# Patient Record
Sex: Male | Born: 1990 | Race: Black or African American | Hispanic: No | Marital: Single | Smoking: Never smoker
Health system: Southern US, Community
[De-identification: ages and names within clinical notes are randomized; demographics above are authoritative.]

## PROBLEM LIST (undated history)

## (undated) DIAGNOSIS — J45909 Unspecified asthma, uncomplicated: Secondary | ICD-10-CM

## (undated) HISTORY — PX: RECONSTRUCTION OF NOSE: SHX2301

## (undated) HISTORY — PX: APPENDECTOMY: SHX54

---

## 2013-10-19 ENCOUNTER — Encounter (HOSPITAL_COMMUNITY): Payer: Self-pay | Admitting: Emergency Medicine

## 2013-10-19 ENCOUNTER — Emergency Department (HOSPITAL_COMMUNITY)
Admission: EM | Admit: 2013-10-19 | Discharge: 2013-10-19 | Disposition: A | Discharge status: Home / Self Care | Payer: Medicaid Other | Attending: Emergency Medicine | Admitting: Emergency Medicine

## 2013-10-19 DIAGNOSIS — W208XXA Other cause of strike by thrown, projected or falling object, initial encounter: Secondary | ICD-10-CM | POA: Insufficient documentation

## 2013-10-19 DIAGNOSIS — S8990XA Unspecified injury of unspecified lower leg, initial encounter: Secondary | ICD-10-CM | POA: Insufficient documentation

## 2013-10-19 DIAGNOSIS — Y939 Activity, unspecified: Secondary | ICD-10-CM | POA: Insufficient documentation

## 2013-10-19 DIAGNOSIS — S99919A Unspecified injury of unspecified ankle, initial encounter: Secondary | ICD-10-CM

## 2013-10-19 DIAGNOSIS — Y929 Unspecified place or not applicable: Secondary | ICD-10-CM | POA: Insufficient documentation

## 2013-10-19 DIAGNOSIS — S99929A Unspecified injury of unspecified foot, initial encounter: Secondary | ICD-10-CM

## 2013-10-19 DIAGNOSIS — S8010XA Contusion of unspecified lower leg, initial encounter: Secondary | ICD-10-CM | POA: Insufficient documentation

## 2013-10-19 DIAGNOSIS — S239XXA Sprain of unspecified parts of thorax, initial encounter: Secondary | ICD-10-CM | POA: Insufficient documentation

## 2013-10-19 DIAGNOSIS — IMO0002 Reserved for concepts with insufficient information to code with codable children: Secondary | ICD-10-CM

## 2013-10-19 DIAGNOSIS — G8921 Chronic pain due to trauma: Secondary | ICD-10-CM | POA: Insufficient documentation

## 2013-10-19 MED ORDER — CYCLOBENZAPRINE HCL 10 MG PO TABS: 10.0000 mg | ORAL_TABLET | Freq: Two times a day (BID) | ORAL | Status: DC | PRN
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
## 2013-10-19 NOTE — ED Notes (Signed)
pa at bedside. 
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
## 2013-10-19 NOTE — Discharge Instructions (Signed)
1. Medications: flexeril, ibuprofen 800mg  TID, usual home medications 2. Treatment: rest, drink plenty of fluids, gentle stretching as discussed, alternate ice and heat 3. Follow Up: Please followup with your primary doctor for discussion of your diagnoses and further evaluation after today's visit; if you do not have a primary care doctor use the resource guide provided to find one;   Back Exercises Back exercises help treat and prevent back injuries. The goal of back exercises is to increase the strength of your abdominal and back muscles and the flexibility of your back. These exercises should be started when you no longer have back pain. Back exercises include:  Pelvic Tilt. Lie on your back with your knees bent. Tilt your pelvis until the lower part of your back is against the floor. Hold this position 5 to 10 sec and repeat 5 to 10 times.  Knee to Chest. Pull first 1 knee up against your chest and hold for 20 to 30 seconds, repeat this with the other knee, and then both knees. This may be done with the other leg straight or bent, whichever feels better.  Sit-Ups or Curl-Ups. Bend your knees 90 degrees. Start with tilting your pelvis, and do a partial, slow sit-up, lifting your trunk only 30 to 45 degrees off the floor. Take at least 2 to 3 seconds for each sit-up. Do not do sit-ups with your knees out straight. If partial sit-ups are difficult, simply do the above but with only tightening your abdominal muscles and holding it as directed.  Hip-Lift. Lie on your back with your knees flexed 90 degrees. Push down with your feet and shoulders as you raise your hips a couple inches off the floor; hold for 10 seconds, repeat 5 to 10 times.  Back arches. Lie on your stomach, propping yourself up on bent elbows. Slowly press on your hands, causing an arch in your low back. Repeat 3 to 5 times. Any initial stiffness and discomfort should lessen with repetition over time.  Shoulder-Lifts. Lie face down  with arms beside your body. Keep hips and torso pressed to floor as you slowly lift your head and shoulders off the floor. Do not overdo your exercises, especially in the beginning. Exercises may cause you some mild back discomfort which lasts for a few minutes; however, if the pain is more severe, or lasts for more than 15 minutes, do not continue exercises until you see your caregiver. Improvement with exercise therapy for back problems is slow.  See your caregivers for assistance with developing a proper back exercise program. Document Released: 10/15/2004 Document Revised: 11/30/2011 Document Reviewed: 07/09/2011 [facility] Patient Information 2014 [place], [place].    Emergency Department Resource Guide 1) Find a Doctor and Pay Out of Pocket Although you won't have to find out who is covered by your insurance plan, it is a good idea to ask around and get recommendations. You will then need to call the office and see if the doctor you have chosen will accept you as a new patient and what types of options they offer for patients who are self-pay. Some doctors offer discounts or will set up payment plans for their patients who do not have insurance, but you will need to ask so you aren't surprised when you get to your appointment.  2) Contact Your Local Health Department Not all health departments have doctors that can see patients for sick visits, but many do, so it is worth a call to see if yours does. If you  don't know where your local health department is, you can check in your phone book. The CDC also has a tool to help you locate your state's health department, and many state websites also have listings of all of their local health departments.  3) Find a [facility] If your illness is not likely to be very severe or complicated, you may want to try a walk in clinic. These are popping up all over the country in pharmacies, drugstores, and shopping centers. They're usually staffed by nurse  practitioners or physician assistants that have been trained to treat common illnesses and complaints. They're usually fairly quick and inexpensive. However, if you have serious medical issues or chronic medical problems, these are probably not your best option.  No Primary Care Doctor: - Call Health Connect at  [phone] - they can help you locate a primary care doctor that  accepts your insurance, provides certain services, etc. - Physician Referral Service- [phone]  Chronic Pain Problems: Organization         Address  Phone   Notes  [facility]  [phone] Patients need to be referred by their primary care doctor.   Medication Assistance: Organization         Address  Phone   Notes  [facility] Medication [facility] [address] [place], [place] [address] [phone] --Must be a resident of [facility] -- Must have NO insurance coverage whatsoever (no Medicaid/ Medicare, etc.) -- The pt. MUST have a primary care doctor that directs their care regularly and follows them in the community   MedAssist  [phone]   [employer]  [phone]    Agencies that provide inexpensive medical care: Organization         Address  Phone   Notes  [place]  [phone]   [facility]    [phone]   [facility] [place], [place] [address] [phone]   [place] [address] [place] [phone]   Planned Parenthood    ([phone]   [facility]    [phone]   [place] and [place], [place] Phone:  [phone], Fax:  [phone] Hours of Operation:  9 am - 6 pm, M-F.  Also accepts Medicaid/Medicare and self-pay.  [facility] for [place], [address], [place] Phone: [phone], Fax: [phone]. Hours of Operation:  8:30 am -  5:30 pm, M-F.  Also accepts Medicaid and self-pay.  [facility] [place] [address], [place] Phone: [phone]   [place], [place], [place] [phone], Ext. [phone] Mondays & Thursdays: 7-9 AM.  First 15 patients are seen on a first come, first serve basis.    [place] Providers:  Organization         Address  Phone   Notes  [facility] [address], [place] [phone] Also accepts self-pay patients.  [place], [place]  [phone]   [place], [address], [place] [phone]   [facility] [address] [place] [phone]   Lucianne Lei [address], [place]   [phone] Only accepts [place]  Access Medicaid patients after they have their name applied to their card.   Self-Pay (no insurance) in [facility]:  Organization         Address  Phone   Notes  Sickle Cell Patients, [facility] [place] [phone]   [facility] [phone]   [facility] [place]  [place], [place], [place] [phone]   [facility]/Dr. Osei-Bonsu  [address], [place] or [address], [place] [phone] Phone number for both [place] and [place] locations is the same.  Urgent Medical and [facility] [address] [place] [phone]   [facility] [address] [place] or [address] [phone]   [facility] [address] [place] [phone], phone; [phone], fax Sees patients 1st and 3rd Saturday of every month.  Must not qualify for public or private insurance (i.e. Medicaid, Medicare, [place] Health Choice,  Veterans' Benefits)  Household income should be no more than 200% of the poverty level The clinic cannot treat you if you are pregnant or think you are pregnant  Sexually transmitted diseases are not treated at the clinic.    Dental Care: Organization         Address  Phone  Notes  [facility] Department of [facility] [place] [phone] Accepts children up to age 67 who are enrolled in [place] or [place]; pregnant women with a Medicaid card; and children who have applied for Medicaid or  Health Choice, but were declined, whose parents can pay a reduced fee at time of service.  [facility] Department of [facility]  [address], [place] [phone] Accepts children up to age 65 who are enrolled in [place] or [place]; pregnant women with a Medicaid card; and children who have applied for Medicaid or  Health Choice, but were declined, whose parents can pay a reduced fee at time of service.  [place] Adult Dental Access PROGRAM  [place] [phone] Patients are seen by appointment only. Walk-ins are not accepted. [place] will see patients 43 years of age and older. Monday - Tuesday (8am-5pm) Most Wednesdays (8:30-5pm) $30 per visit, cash only  [facility] Adult Dental Access PROGRAM  [address], [facility] [phone] Patients are seen by appointment only. Walk-ins are not accepted. [place] will see patients 42 years of age and older. One Wednesday Evening (Monthly: Volunteer Based).  $30 per visit, cash only  [place]  [phone] for adults; Children under age 83, call Graduate Pediatric Dentistry at [phone]. Children aged 65-14, please call [phone] to request a pediatric application.  Dental services are provided in all areas of dental care including fillings, crowns and bridges, complete and  partial dentures, implants, gum treatment, root canals, and extractions. Preventive care is also provided. Treatment is provided to both adults and children. Patients are selected via a lottery and there is often a waiting list.   [facility] [address] [place]  [phone] [email or web address], [place], [place] [phone], Ext. 123 Second and Fourth Thursday of each month, opens at 6:30 AM; Clinic ends at 9  AM.  Patients are seen on a first-come first-served basis, and a limited number are seen during each clinic.   [facility]  [address] Hillard Danker [place], [place] [phone]   Eligibility Requirements You must have lived in [place], [place], or [place] counties for at least the last three months.   You cannot be eligible for state or federal sponsored [employer], including [employer], [place], or [employer].   You generally cannot be eligible for healthcare insurance through your employer.    How to apply: Eligibility screenings are held every Tuesday and Wednesday afternoon from 1:00 pm until 4:00 pm. You do not need an appointment for the interview!  [facility] [address] [place], [place]   [place]  [place] Department  [place]  [phone]    Behavioral Health Resources in the Community: Intensive Outpatient Programs Organization         Address  Phone  Notes  [place]. [address], [place], [place] [phone]   [facility] Outpatient [address] [place], [place]   ADS: Alcohol & Drug Svcs [address] [place], [place]   [place] [address],  [place], [place] or [phone]   Substance Abuse Resources Organization          Address  Phone  Notes  Alcohol and Drug Services  [phone]   [place]  [phone]   [facility]   Chinita Pester  [phone]   Residential & Outpatient Substance Abuse Program  [phone]   Psychological Services Organization         Address  Phone  Notes  [facility] [place]  [place]  [phone]   [place] [address], [place] or [phone]    Mobile Crisis Teams Organization         Address  Phone  Notes  [facility], Mobile Crisis Care Unit  [phone]   Assertive Psychotherapeutic Services  [address] [place], [place]   [place] Levels [address] [place] [phone]    Self-Help/Support Groups Organization         Address  Phone             Notes  [place]. of [place] - variety of support groups  [place] Call for more information  Narcotics Anonymous (NA), [facility]aring [facility] [address], [employer] [place]  2 meetings at this location   Special educational needs teacher         Address  Phone  Notes  ASAP Residential Treatment [place],    [place]  [phone]   [facility]  [address], [place] [phone], [place], [place]   [place], [place] [phone] Admissions: 8am-3pm M-F  Incentives Substance [place] [address],    [place], [place] [phone]   [facility] [address] Jadene Pierini [place], [place]   [facility] [address],  [place], [place]   [facility]ight Programs - Intensive Outpatient [address] Krist[address], [place], [place]   [facility] ([place].,  [place], [place] or [phone]   Residential Treatment Services (RTS)  [address] [place], [place] Accepts Medicaid  [facility] [place] [address]  [place] [phone] Substance Abuse/Addiction Treatment   [facility] Organization         Address  Phone  Notes  [facility]  [phone]   Domenic Schwab, PhD [address] Arlis Porta [place], [place]   [phone] or [phone]   [facility] [place], [place] [phone]   [place] [address], [place], [place] [phone] Insurance/Medicaid/sponsorship through [facility] and Families [address], Ste [place]                                    [place], [place] [phone] [place] [address], [place] [phone]    Dr. Adele Schilder  [phone]   [facility] Dept. 1) [address], [place] 2) [place] 3)  [place] [address], [place] [phone]  [phone]   [place] [phone] or [phone] (After Hours)

## 2013-10-20 NOTE — ED Provider Notes (Signed)
Medical screening examination/treatment/procedure(s) were performed by non-physician practitioner and as supervising physician I was immediately available for consultation/collaboration.  EKG Interpretation   None        Zakariah Dejarnette, MD 10/20/13 0041 

## 2013-11-22 ENCOUNTER — Emergency Department (HOSPITAL_COMMUNITY)
Admission: EM | Admit: 2013-11-22 | Discharge: 2013-11-23 | Disposition: A | Discharge status: Home / Self Care | Payer: Medicaid Other | Attending: Emergency Medicine | Admitting: Emergency Medicine

## 2013-11-22 ENCOUNTER — Encounter (HOSPITAL_COMMUNITY): Payer: Self-pay | Admitting: Emergency Medicine

## 2013-11-22 DIAGNOSIS — J069 Acute upper respiratory infection, unspecified: Secondary | ICD-10-CM | POA: Insufficient documentation

## 2013-11-22 DIAGNOSIS — R1110 Vomiting, unspecified: Secondary | ICD-10-CM | POA: Insufficient documentation

## 2013-11-22 MED ORDER — AZITHROMYCIN 250 MG PO TABS: ORAL_TABLET | ORAL | Status: DC

## 2013-11-22 MED ORDER — HYDROCODONE-HOMATROPINE 5-1.5 MG/5ML PO SYRP: 5.0000 mL | ORAL_SOLUTION | Freq: Four times a day (QID) | ORAL | Status: DC | PRN

## 2013-11-22 NOTE — ED Provider Notes (Signed)
CSN: [id number]     Arrival date & time 11/22/13  2133 History  This chart was scribed for non-physician practitioner, Arthor CaptainAbigail Montie Gelardi, PA-C working with Lyanne CoKevin M Campos, MD by Greggory StallionKayla Andersen, ED scribe. This patient was seen in room TR06C/TR06C and the patient's care was started at 11:33 PM.    Chief Complaint  Patient presents with  . URI   The history is provided by the patient. No language interpreter was used.   HPI Comments: Dustin Haley is a 23 y.o. male who presents to the Emergency Department complaining of productive cough, rhinorrhea, sneezing and diaphoresis that started 5 days ago. He has also had post tussive emesis and decreased appetite. Pt has taken NyQuil with little relief. Denies fever, rash. Denies sick contacts.   No past medical history on file. No past surgical history on file. No family history on file. History  Substance Use Topics  . Smoking status: Never Smoker   . Smokeless tobacco: Not on file  . Alcohol Use: No    Review of Systems  Constitutional: Positive for diaphoresis and appetite change. Negative for fever.  HENT: Positive for rhinorrhea and sneezing.   Eyes: Negative for redness.  Respiratory: Positive for cough.   Cardiovascular: Negative for chest pain.  Gastrointestinal: Positive for vomiting (post tussive). Negative for abdominal distention.  Musculoskeletal: Negative for gait problem.  Skin: Negative for rash.  Neurological: Negative for speech difficulty.  Psychiatric/Behavioral: Negative for confusion.   Allergies  Review of patient's allergies indicates no known allergies.  Home Medications   Current Outpatient Rx  Name  Route  Sig  Dispense  Refill  . cyclobenzaprine (FLEXERIL) 10 MG tablet   Oral   Take 1 tablet (10 mg total) by mouth 2 (two) times daily as needed for muscle spasms.   20 tablet   0    BP 122/65  Pulse 94  Temp(Src) 99.1 F (37.3 C) (Oral)  Resp 18  SpO2 99%  Physical Exam  Nursing note and vitals  reviewed. Constitutional: He is oriented to person, place, and time. He appears well-developed and well-nourished. No distress.  HENT:  Head: Normocephalic and atraumatic.  Eyes: EOM are normal.  Neck: Neck supple. No tracheal deviation present.  Cardiovascular: Normal rate.   Pulmonary/Chest: Effort normal and breath sounds normal. No respiratory distress. He has no wheezes. He has no rhonchi. He has no rales.  Abdominal: Soft. There is no tenderness.  Musculoskeletal: Normal range of motion.  Neurological: He is alert and oriented to person, place, and time.  Skin: Skin is warm and dry.  Psychiatric: He has a normal mood and affect. His behavior is normal.    ED Course  Procedures (including critical care time)  DIAGNOSTIC STUDIES: Oxygen Saturation is 99% on RA, normal by my interpretation.    COORDINATION OF CARE: 11:36 PM-Discussed treatment plan which includes an antibiotic and cough syrup with pt at bedside and pt agreed to plan.   Labs Review Labs Reviewed - No data to display Imaging Review No results found.   EKG Interpretation None      MDM   Final diagnoses:  URI (upper respiratory infection)     Symptomatic therapy suggested: push fluids, rest, gargle warm salt water, use OTC meds prn and return ED  if symptomsworsen.ABX given.  I personally performed the services described in this documentation, which was scribed in my presence. The recorded information has been reviewed and is accurate.     Arthor CaptainAbigail Vaughn Beaumier, PA-C 11/23/13 1746

## 2013-11-22 NOTE — Discharge Instructions (Signed)
You appear to have an upper respiratory infection (URI). An upper respiratory tract infection, or cold, is a viral infection of the air passages leading to the lungs. It is contagious and can be spread to others, especially during the first 3 or 4 days. It cannot be cured by antibiotics or other medicines. RETURN IMMEDIATELY IF you develop shortness of breath, confusion or altered mental status, a new rash, become dizzy, faint, or poorly responsive, or are unable to be cared for at home.  Azithromycin tablets What is this medicine? AZITHROMYCIN (az ith roe MYE sin) is a macrolide antibiotic. It is used to treat or prevent certain kinds of bacterial infections. It will not work for colds, flu, or other viral infections. This medicine may be used for other purposes; ask your health care provider or pharmacist if you have questions. COMMON BRAND NAME(S): Zithromax Tri-Pak, Zithromax Z-Pak, Zithromax What should I tell my health care provider before I take this medicine? They need to know if you have any of these conditions: -kidney disease -liver disease -irregular heartbeat or heart disease -an unusual or allergic reaction to azithromycin, erythromycin, other macrolide antibiotics, foods, dyes, or preservatives -pregnant or trying to get pregnant -breast-feeding How should I use this medicine? Take this medicine by mouth with a full glass of water. Follow the directions on the prescription label. The tablets can be taken with food or on an empty stomach. If the medicine upsets your stomach, take it with food. Take your medicine at regular intervals. Do not take your medicine more often than directed. Take all of your medicine as directed even if you think your are better. Do not skip doses or stop your medicine early. Talk to your pediatrician regarding the use of this medicine in children. Special care may be needed. Overdosage: If you think you have taken too much of this medicine contact a poison  control center or emergency room at once. NOTE: This medicine is only for you. Do not share this medicine with others. What if I miss a dose? If you miss a dose, take it as soon as you can. If it is almost time for your next dose, take only that dose. Do not take double or extra doses. What may interact with this medicine? Do not take this medicine with any of the following medications: -lincomycin This medicine may also interact with the following medications: -amiodarone -antacids -cyclosporine -digoxin -magnesium -nelfinavir -phenytoin -warfarin This list may not describe all possible interactions. Give your health care provider a list of all the medicines, herbs, non-prescription drugs, or dietary supplements you use. Also tell them if you smoke, drink alcohol, or use illegal drugs. Some items may interact with your medicine. What should I watch for while using this medicine? Tell your doctor or health care professional if your symptoms do not improve. Do not treat diarrhea with over the counter products. Contact your doctor if you have diarrhea that lasts more than 2 days or if it is severe and watery. This medicine can make you more sensitive to the sun. Keep out of the sun. If you cannot avoid being in the sun, wear protective clothing and use sunscreen. Do not use sun lamps or tanning beds/booths. What side effects may I notice from receiving this medicine? Side effects that you should report to your doctor or health care professional as soon as possible: -allergic reactions like skin rash, itching or hives, swelling of the face, lips, or tongue -confusion, nightmares or hallucinations -dark urine -  difficulty breathing -hearing loss -irregular heartbeat or chest pain -pain or difficulty passing urine -redness, blistering, peeling or loosening of the skin, including inside the mouth -Shepard patches or sores in the mouth -yellowing of the eyes or skin Side effects that usually  do not require medical attention (report to your doctor or health care professional if they continue or are bothersome): -diarrhea -dizziness, drowsiness -headache -stomach upset or vomiting -tooth discoloration -vaginal irritation This list may not describe all possible side effects. Call your doctor for medical advice about side effects. You may report side effects to FDA at 1-800-FDA-1088. Where should I keep my medicine? Keep out of the reach of children. Store at room temperature between 15 and 30 degrees C (59 and 86 degrees F). Throw away any unused medicine after the expiration date. NOTE: This sheet is a summary. It may not cover all possible information. If you have questions about this medicine, talk to your doctor, pharmacist, or health care provider.  2014, Elsevier/Gold Standard. (2013-03-01 15:42:07) Homatropine; Hydrocodone oral syrup What is this medicine? HYDROCODONE (hye droe KOE done) is used to help relieve cough. This medicine may be used for other purposes; ask your health care provider or pharmacist if you have questions. COMMON BRAND NAME(S): Hycodan, Hydromet, Hydropane, Mycodone What should I tell my health care provider before I take this medicine? They need to know if you have any of these conditions: -brain tumor -drug abuse or addiction -head injury -heart disease -if you frequently drink alcohol-containing drinks -kidney disease -liver disease -lung disease, asthma, or breathing problems -mental problems -an allergic reaction to hydrocodone, other opioid analgesics, other medicines, foods, dyes, or preservatives -pregnant or trying to get pregnant -breast-feeding How should I use this medicine? Take this medicine by mouth with a full glass of water. Use a specially marked spoon or dropper to measure your dose. Ask your pharmacist if you do not have one. Do not use a household spoon. Follow the directions on the prescription label. Take your medicine  at regular intervals. Do not take your medicine more often than directed. Talk to your pediatrician regarding the use of this medicine in children. This medicine is not approved for use in children less than 22 years old. Patients over 22 years old may have a stronger reaction and need a smaller dose. Overdosage: If you think you have taken too much of this medicine contact a poison control center or emergency room at once. NOTE: This medicine is only for you. Do not share this medicine with others. What if I miss a dose? If you miss a dose, take it as soon as you can. If it is almost time for your next dose, take only that dose. Do not take double or extra doses. What may interact with this medicine? -alcohol -antihistamines -MAOIs like Carbex, Eldepryl, Marplan, Nardil, and Parnate -medicines for depression, anxiety, or psychotic disturbances -medicines for sleep -muscle relaxants -naltrexone -narcotic medicines (opiates) for pain -tramadol -tricyclic antidepressants like amitriptyline, clomipramine, desipramine, doxepin, imipramine, nortriptyline, and protriptyline This list may not describe all possible interactions. Give your health care provider a list of all the medicines, herbs, non-prescription drugs, or dietary supplements you use. Also tell them if you smoke, drink alcohol, or use illegal drugs. Some items may interact with your medicine. What should I watch for while using this medicine? You may develop tolerance to this medicine if you take it for a long time. Tolerance means that you will get less cough relief with time.  Tell your doctor or health care professional if you symptoms do not improve or if they get worse. Do not suddenly stop taking your medicine because you may develop a severe reaction. Your body becomes used to the medicine. This does NOT mean you are addicted. Addiction is a behavior related to getting and using a drug for a non-medical reason. If your doctor wants  you to stop the medicine, the dose will be slowly lowered over time to avoid any side effects. You may get drowsy or dizzy. Do not drive, use machinery, or do anything that needs mental alertness until you know how this medicine affects you. Do not stand or sit up quickly, especially if you are an older patient. This reduces the risk of dizzy or fainting spells. Alcohol may interfere with the effect of this medicine. Avoid alcoholic drinks. This medicine will cause constipation. Try to have a bowel movement at least every 2 to 3 days. If you do not have a bowel movement for 3 days, call your doctor or health care professional. What side effects may I notice from receiving this medicine? Side effects that you should report to your doctor or health care professional as soon as possible: -allergic reactions like skin rash, itching or hives, swelling of the face, lips, or tongue -breathing difficulties, wheezing -confusion -light headedness or fainting spells Side effects that usually do not require medical attention (report to your doctor or health care professional if they continue or are bothersome): -dizziness -drowsiness -itching -nausea -vomiting This list may not describe all possible side effects. Call your doctor for medical advice about side effects. You may report side effects to FDA at 1-800-FDA-1088. Where should I keep my medicine? Keep out of the reach of children. This medicine can be abused. Keep your medicine in a safe place to protect it from theft. Do not share this medicine with anyone. Selling or giving away this medicine is dangerous and against the law. Store at room temperature between 15 and 30 degrees C (59 and 86 degrees F). Protect from light. Throw away any unused medicine after the expiration date. Discard unused medicine and used packaging carefully. Pets and children can be harmed if they find used or lost packages. NOTE: This sheet is a summary. It may not cover all  possible information. If you have questions about this medicine, talk to your doctor, pharmacist, or health care provider.  2014, Elsevier/Gold Standard. (2011-05-18 10:04:07) Influenza, Adult Influenza ("the flu") is a viral infection of the respiratory tract. It occurs more often in winter months because people spend more time in close contact with one another. Influenza can make you feel very sick. Influenza easily spreads from person to person (contagious). CAUSES  Influenza is caused by a virus that infects the respiratory tract. You can catch the virus by breathing in droplets from an infected person's cough or sneeze. You can also catch the virus by touching something that was recently contaminated with the virus and then touching your mouth, nose, or eyes. SYMPTOMS  Symptoms typically last 4 to 10 days and may include:  Fever.  Chills.  Headache, body aches, and muscle aches.  Sore throat.  Chest discomfort and cough.  Poor appetite.  Weakness or feeling tired.  Dizziness.  Nausea or vomiting. DIAGNOSIS  Diagnosis of influenza is often made based on your history and a physical exam. A nose or throat swab test can be done to confirm the diagnosis. RISKS AND COMPLICATIONS You may be at risk  for a more severe case of influenza if you smoke cigarettes, have diabetes, have chronic heart disease (such as heart failure) or lung disease (such as asthma), or if you have a weakened immune system. Elderly people and pregnant women are also at risk for more serious infections. The most common complication of influenza is a lung infection (pneumonia). Sometimes, this complication can require emergency medical care and may be life-threatening. PREVENTION  An annual influenza vaccination (flu shot) is the best way to avoid getting influenza. An annual flu shot is now routinely recommended for all adults in the [place] TREATMENT  In mild cases, influenza goes away on its own. Treatment is  directed at relieving symptoms. For more severe cases, your caregiver may prescribe antiviral medicines to shorten the sickness. Antibiotic medicines are not effective, because the infection is caused by a virus, not by bacteria. HOME CARE INSTRUCTIONS  Only take over-the-counter or prescription medicines for pain, discomfort, or fever as directed by your caregiver.  Use a cool mist humidifier to make breathing easier.  Get plenty of rest until your temperature returns to normal. This usually takes 3 to 4 days.  Drink enough fluids to keep your urine clear or pale yellow.  Cover your mouth and nose when coughing or sneezing, and wash your hands well to avoid spreading the virus.  Stay home from work or school until your fever has been gone for at least 1 full day. SEEK MEDICAL CARE IF:   You have chest pain or a deep cough that worsens or produces more mucus.  You have nausea, vomiting, or diarrhea. SEEK IMMEDIATE MEDICAL CARE IF:   You have difficulty breathing, shortness of breath, or your skin or nails turn bluish.  You have severe neck pain or stiffness.  You have a severe headache, facial pain, or earache.  You have a worsening or recurring fever.  You have nausea or vomiting that cannot be controlled. MAKE SURE YOU:  Understand these instructions.  Will watch your condition.  Will get help right away if you are not doing well or get worse. Document Released: 09/04/2000 Document Revised: 03/08/2012 Document Reviewed: 12/07/2011 [facility] Patient Information 2014 [place], [place].

## 2013-11-22 NOTE — ED Notes (Signed)
Presents with runny nose, cough and "not feeling great" for 5 days. Reports coughing so hard that it causes him to throw up. Denies fevers. His child is sick as well.

## 2013-11-24 NOTE — ED Provider Notes (Signed)
Medical screening examination/treatment/procedure(s) were performed by non-physician practitioner and as supervising physician I was immediately available for consultation/collaboration.   EKG Interpretation None        Dustin Haley M Allysia Ingles, MD 11/24/13 0004 

## 2014-03-18 ENCOUNTER — Encounter (HOSPITAL_COMMUNITY): Payer: Self-pay | Admitting: Emergency Medicine

## 2014-03-18 ENCOUNTER — Emergency Department (HOSPITAL_COMMUNITY)
Admission: EM | Admit: 2014-03-18 | Discharge: 2014-03-18 | Disposition: A | Discharge status: Home / Self Care | Payer: Medicaid Other | Attending: Emergency Medicine | Admitting: Emergency Medicine

## 2014-03-18 DIAGNOSIS — Y929 Unspecified place or not applicable: Secondary | ICD-10-CM | POA: Insufficient documentation

## 2014-03-18 DIAGNOSIS — IMO0002 Reserved for concepts with insufficient information to code with codable children: Secondary | ICD-10-CM | POA: Insufficient documentation

## 2014-03-18 DIAGNOSIS — X58XXXA Exposure to other specified factors, initial encounter: Secondary | ICD-10-CM | POA: Insufficient documentation

## 2014-03-18 DIAGNOSIS — Y9389 Activity, other specified: Secondary | ICD-10-CM | POA: Insufficient documentation

## 2014-03-18 DIAGNOSIS — J45909 Unspecified asthma, uncomplicated: Secondary | ICD-10-CM | POA: Insufficient documentation

## 2014-03-18 DIAGNOSIS — Z79899 Other long term (current) drug therapy: Secondary | ICD-10-CM | POA: Insufficient documentation

## 2014-03-18 DIAGNOSIS — R51 Headache: Secondary | ICD-10-CM | POA: Insufficient documentation

## 2014-03-18 DIAGNOSIS — R21 Rash and other nonspecific skin eruption: Secondary | ICD-10-CM | POA: Insufficient documentation

## 2014-03-18 DIAGNOSIS — S39012A Strain of muscle, fascia and tendon of lower back, initial encounter: Secondary | ICD-10-CM

## 2014-03-18 HISTORY — DX: Unspecified asthma, uncomplicated: J45.909

## 2014-03-18 MED ORDER — PREDNISONE 20 MG PO TABS: ORAL_TABLET | ORAL | Status: DC

## 2014-03-18 MED ORDER — NAPROXEN 500 MG PO TABS: 500.0000 mg | ORAL_TABLET | Freq: Two times a day (BID) | ORAL | Status: DC

## 2014-03-18 MED ORDER — METHOCARBAMOL 500 MG PO TABS: 1000.0000 mg | ORAL_TABLET | Freq: Four times a day (QID) | ORAL | Status: DC

## 2014-03-18 MED ORDER — NAPROXEN 250 MG PO TABS
500.0000 mg | ORAL_TABLET | Freq: Once | ORAL | Status: AC
  Administered 2014-03-18: 500 mg via ORAL
  Filled 2014-03-18: qty 2
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
## 2014-03-18 NOTE — Discharge Instructions (Signed)
Please read and follow all provided instructions.  Your diagnoses today include:  1. Back strain, initial encounter   2. Rash and nonspecific skin eruption     Tests performed today include:  Vital signs - see below for your results today  Medications prescribed:   Robaxin (methocarbamol) - muscle relaxer medication  DO NOT drive or perform any activities that require you to be awake and alert because this medicine can make you drowsy.    Naproxen - anti-inflammatory pain medication  Do not exceed 500mg  naproxen every 12 hours, take with food  You have been prescribed an anti-inflammatory medication or NSAID. Take with food. Take smallest effective dose for the shortest duration needed for your pain. Stop taking if you experience stomach pain or vomiting.    Prednisone - steroid medicine   It is best to take this medication in the morning to prevent sleeping problems. If you are diabetic, monitor your blood sugar closely and stop taking Prednisone if blood sugar is over 300. Take with food to prevent stomach upset.   Take any prescribed medications only as directed.  Home care instructions:   Follow any educational materials contained in this packet  Please rest, use ice or heat on your back for the next several days  Do not lift, push, pull anything more than 10 pounds for the next week  Follow-up instructions: Please follow-up with your primary care provider in the next 1 week for further evaluation of your symptoms. [facility] to schedule an appointment.   Return instructions:  SEEK IMMEDIATE MEDICAL ATTENTION IF YOU HAVE:  New numbness, tingling, weakness, or problem with the use of your arms or legs  Severe back pain not relieved with medications  Loss control of your bowels or bladder  Increasing pain in any areas of the body (such as chest or abdominal pain)  Shortness of breath, dizziness, or fainting.   Worsening nausea (feeling sick to  your stomach), vomiting, fever, or sweats  Any other emergent concerns regarding your health   Additional Information:  Your vital signs today were: BP 138/55   Pulse 87   Temp(Src) 98.3 F (36.8 C) (Oral)   Resp 16   Ht 5\' 9"  (1.753 m)   Wt 175 lb (79.379 kg)   BMI 25.83 kg/m2   SpO2 97% If your blood pressure (BP) was elevated above 135/85 this visit, please have this repeated by your doctor within one month. --------------
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
## 2014-03-19 NOTE — ED Provider Notes (Signed)
Medical screening examination/treatment/procedure(s) were performed by non-physician practitioner and as supervising physician I was immediately available for consultation/collaboration.   EKG Interpretation None        Courtney F Horton, MD 03/19/14 1934 

## 2014-03-20 DIAGNOSIS — Z791 Long term (current) use of non-steroidal anti-inflammatories (NSAID): Secondary | ICD-10-CM | POA: Insufficient documentation

## 2014-03-20 DIAGNOSIS — Z792 Long term (current) use of antibiotics: Secondary | ICD-10-CM | POA: Insufficient documentation

## 2014-03-20 DIAGNOSIS — M545 Low back pain, unspecified: Secondary | ICD-10-CM | POA: Insufficient documentation

## 2014-03-20 DIAGNOSIS — Z79899 Other long term (current) drug therapy: Secondary | ICD-10-CM | POA: Insufficient documentation

## 2014-03-20 DIAGNOSIS — J45909 Unspecified asthma, uncomplicated: Secondary | ICD-10-CM | POA: Insufficient documentation

## 2014-03-21 ENCOUNTER — Emergency Department (HOSPITAL_COMMUNITY)
Admission: EM | Admit: 2014-03-21 | Discharge: 2014-03-21 | Disposition: A | Discharge status: Home / Self Care | Payer: Medicaid Other | Attending: Emergency Medicine | Admitting: Emergency Medicine

## 2014-03-21 ENCOUNTER — Encounter (HOSPITAL_COMMUNITY): Payer: Self-pay | Admitting: Emergency Medicine

## 2014-03-21 DIAGNOSIS — M545 Low back pain, unspecified: Secondary | ICD-10-CM

## 2014-03-21 MED ORDER — KETOROLAC TROMETHAMINE 60 MG/2ML IM SOLN
30.0000 mg | Freq: Once | INTRAMUSCULAR | Status: AC
  Administered 2014-03-21: 30 mg via INTRAMUSCULAR
  Filled 2014-03-21: qty 2

## 2014-03-21 MED ORDER — DIAZEPAM 5 MG PO TABS
5.0000 mg | ORAL_TABLET | Freq: Once | ORAL | Status: AC
  Administered 2014-03-21: 5 mg via ORAL
  Filled 2014-03-21: qty 1

## 2014-03-21 MED ORDER — HYDROCODONE-ACETAMINOPHEN 5-325 MG PO TABS
1.0000 | ORAL_TABLET | Freq: Once | ORAL | Status: AC
  Administered 2014-03-21: 1 via ORAL
  Filled 2014-03-21: qty 1

## 2014-03-21 MED ORDER — DIAZEPAM 5 MG PO TABS: 5.0000 mg | ORAL_TABLET | Freq: Two times a day (BID) | ORAL | Status: AC

## 2014-03-21 MED ORDER — KETOROLAC TROMETHAMINE 10 MG PO TABS: 10.0000 mg | ORAL_TABLET | Freq: Four times a day (QID) | ORAL | Status: DC | PRN

## 2014-03-21 MED ORDER — HYDROCODONE-ACETAMINOPHEN 5-325 MG PO TABS: ORAL_TABLET | ORAL | Status: DC

## 2014-03-21 NOTE — ED Notes (Signed)
Patient here with increased back pain since previous visit 2 days ago. States that he has been experiencing some paresthesia in his lower extremities. States no injury. Explains that he went to work directly after leaving ED from previous visit.

## 2014-03-21 NOTE — Discharge Instructions (Signed)
°  Do not combine ketorolac (toradol) with any other NSAID (motrin, ibuprofen, Advil, aleve , aspirin, naproxen etc.) Take fist ketorolac pill tomorrow, you have already had a shot today  Take valium and/or Vicodin for breakthrough pain, do not drink alcohol, drive, care for children or perfom other critical tasks while taking valium and/or Vicodin.  Please follow with your primary care doctor in the next 2 days for a check-up. They must obtain records for further management.   Do not hesitate to return to the Emergency Department for any new, worsening or concerning symptoms.   Do not hesitate to return to the Emergency Department for any new, worsening or concerning symptoms.   If you do not have a primary care doctor you can establish one at the   [facility]: [address] [place] [id number] [phone]  After you establish care. Let them know you were seen in the emergency room. They must obtain records for further management.     Back Pain, Adult Back pain is very common. The pain often gets better over time. The cause of back pain is usually not dangerous. Most people can learn to manage their back pain on their own.  HOME CARE   Stay active. Start with short walks on flat ground if you can. Try to walk farther each day.  Do not sit, drive, or stand in one place for more than 30 minutes. Do not stay in bed.  Do not avoid exercise or work. Activity can help your back heal faster.  Be careful when you bend or lift an object. Bend at your knees, keep the object close to you, and do not twist.  Sleep on a firm mattress. Lie on your side, and bend your knees. If you lie on your back, put a pillow under your knees.  Only take medicines as told by your doctor.  Put ice on the injured area.  Put ice in a plastic bag.  Place a towel between your skin and the bag.  Leave the ice on for 15-20 minutes, 03-04 times a day for the first 2 to 3 days. After that,  you can switch between ice and heat packs.  Ask your doctor about back exercises or massage.  Avoid feeling anxious or stressed. Find good ways to deal with stress, such as exercise. GET HELP RIGHT AWAY IF:   Your pain does not go away with rest or medicine.  Your pain does not go away in 1 week.  You have new problems.  You do not feel well.  The pain spreads into your legs.  You cannot control when you poop (bowel movement) or pee (urinate).  Your arms or legs feel weak or lose feeling (numbness).  You feel sick to your stomach (nauseous) or throw up (vomit).  You have belly (abdominal) pain.  You feel like you may pass out (faint). MAKE SURE YOU:   Understand these instructions.  Will watch your condition.  Will get help right away if you are not doing well or get worse. Document Released: 02/24/2008 Document Revised: 11/30/2011 Document Reviewed: 01/26/2011 [facility] Patient Information 2015 [employer] [place]. This information is not intended to replace advice given to you by your health care provider. Make sure you discuss any questions you have with your health care provider.

## 2014-03-21 NOTE — ED Provider Notes (Signed)
CSN: [id number]     Arrival date & time 03/20/14  2346 History   First MD Initiated Contact with Patient 03/21/14 0030     Chief Complaint  Patient presents with  . Back Pain     (Consider location/radiation/quality/duration/timing/severity/associated sxs/prior Treatment) HPI  Dustin Haley is a 23 y.o. male with multiple complaints, mainly exacerbation of low back pain. No recent trauma the patient states he works in a [employer], [employer] and is constantly lifting. Patient says that he is not sleeping well, eating and drinking less than normal, states that he has a bilateral knee pain, and feels generally ill. Patient denies fever, chills, cough, chest pain, shortness of breath, runny nose, sore throat, abdominal pain, change in bowel or bladder habits, h/o IDVU or cancer, numbness or weakness.    Past Medical History  Diagnosis Date  . Asthma    History reviewed. No pertinent past surgical history. History reviewed. No pertinent family history. History  Substance Use Topics  . Smoking status: Never Smoker   . Smokeless tobacco: Not on file  . Alcohol Use: No    Review of Systems  10 systems reviewed and found to be negative, except as noted in the HPI.   Allergies  Review of patient's allergies indicates no known allergies.  Home Medications   Prior to Admission medications   Medication Sig Start Date End Date Taking? Authorizing Provider  azithromycin (ZITHROMAX Z-PAK) 250 MG tablet 2 po day one, then 1 daily x 4 days 11/22/13   Arthor Captain, PA-C  cyclobenzaprine (FLEXERIL) 10 MG tablet Take 1 tablet (10 mg total) by mouth 2 (two) times daily as needed for muscle spasms. 10/19/13   Hannah Muthersbaugh, PA-C  diazepam (VALIUM) 5 MG tablet Take 1 tablet (5 mg total) by mouth 2 (two) times daily. 03/21/14   Arieh Bogue, PA-C  HYDROcodone-acetaminophen (NORCO/VICODIN) 5-325 MG per tablet Take 1-2 tablets by mouth every 6 hours as needed for pain. 03/21/14   Masaru Chamberlin,  PA-C  HYDROcodone-homatropine (HYCODAN) 5-1.5 MG/5ML syrup Take 5 mLs by mouth every 6 (six) hours as needed for cough. 11/22/13   Arthor Captain, PA-C  ketorolac (TORADOL) 10 MG tablet Take 1 tablet (10 mg total) by mouth every 6 (six) hours as needed (Take with food. Do not take more than 4 per day. Do not take for longer than 5 days). 03/21/14   Quantrell Splitt, PA-C  methocarbamol (ROBAXIN) 500 MG tablet Take 2 tablets (1,000 mg total) by mouth 4 (four) times daily. 03/18/14   Renne Crigler, PA-C  naproxen (NAPROSYN) 500 MG tablet Take 1 tablet (500 mg total) by mouth 2 (two) times daily. 03/18/14   Renne Crigler, PA-C  predniSONE (DELTASONE) 20 MG tablet 3 Tabs PO Days 1-3, then 2 tabs PO Days 4-6, then 1 tab PO Day 7-9, then Half Tab PO Day 10-12 03/18/14   Renne Crigler, PA-C   BP 128/66  Pulse 85  Temp(Src) 98.7 F (37.1 C) (Oral)  Resp 22  Ht 5\' 9"  (1.753 m)  Wt 170 lb 14.4 oz (77.52 kg)  BMI 25.23 kg/m2  SpO2 99% Physical Exam  Nursing note and vitals reviewed. Constitutional: He is oriented to person, place, and time. He appears well-developed and well-nourished. No distress.  HENT:  Head: Normocephalic and atraumatic.  Mouth/Throat: Oropharynx is clear and moist.  No drooling or stridor. Posterior pharynx mildly erythematous no significant tonsillar hypertrophy. No exudate. Soft palate rises symmetrically. No TTP or induration under tongue.   No  tenderness to palpation of frontal or bilateral maxillary sinuses.  No mucosal edema in the nares.  Bilateral tympanic membranes with normal architecture and good light reflex.    Eyes: Conjunctivae and EOM are normal. Pupils are equal, round, and reactive to light.  Neck: Normal range of motion.  Cardiovascular: Normal rate, regular rhythm and intact distal pulses.   Pulmonary/Chest: Effort normal and breath sounds normal. No stridor. No respiratory distress. He has no wheezes. He has no rales. He exhibits no tenderness.  Abdominal:  Soft. Bowel sounds are normal. He exhibits no distension and no mass. There is no tenderness. There is no rebound and no guarding.  Musculoskeletal: Normal range of motion. He exhibits tenderness. He exhibits no edema.       Arms: Lymphadenopathy:    He has no cervical adenopathy.  Neurological: He is alert and oriented to person, place, and time.  Psychiatric: He has a normal mood and affect.    ED Course  Procedures (including critical care time) Labs Review Labs Reviewed - No data to display  Imaging Review No results found.   EKG Interpretation None      MDM   Final diagnoses:  Midline low back pain without sciatica    Filed Vitals:   03/20/14 2353  BP: 128/66  Pulse: 85  Temp: 98.7 F (37.1 C)  TempSrc: Oral  Resp: 22  Height: 5\' 9"  (1.753 m)  Weight: 170 lb 14.4 oz (77.52 kg)  SpO2: 99%    Medications  ketorolac (TORADOL) injection 30 mg (30 mg Intramuscular Given 03/21/14 0113)  diazepam (VALIUM) tablet 5 mg (5 mg Oral Given 03/21/14 0113)  HYDROcodone-acetaminophen (NORCO/VICODIN) 5-325 MG per tablet 1 tablet (1 tablet Oral Given 03/21/14 0113)    Dustin Haley is a 23 y.o. male presenting with  back pain.  No neurological deficits and normal neuro exam.  Patient can walk but states is painful.  No loss of bowel or bladder control.  No concern for cauda equina.  No fever, night sweats, weight loss, h/o cancer, IVDU.  RICE protocol and pain medicine indicated and discussed with patient.  Patient states he does not have a primary care doctor but that he has Medicaid. I have advised him that he has a primary care doctor listed on his Medicaid card. Advised very important that he establish care. Patient is requesting a note for "bed rest." [facility] he just wants to sleep. States that he works third shift and once a day to sleep before work.  Evaluation does not show pathology that would require ongoing emergent  intervention or inpatient treatment. Pt is hemodynamically stable and mentating appropriately. Discussed findings and plan with patient/guardian, who agrees with care plan. All questions answered. Return precautions discussed and outpatient follow up given.   Discharge Medication List as of 03/21/2014  1:08 AM    START taking these medications   Details  diazepam (VALIUM) 5 MG tablet Take 1 tablet (5 mg total) by mouth 2 (two) times daily., Starting 03/21/2014, Until Discontinued, Print    HYDROcodone-acetaminophen (NORCO/VICODIN) 5-325 MG per tablet Take 1-2 tablets by mouth every 6 hours as needed for pain., Print    ketorolac (TORADOL) 10 MG tablet Take 1 tablet (10 mg total) by mouth every 6 (six) hours as needed (Take with food. Do not take more than 4 per day. Do not take for longer than 5 days)., Starting 03/21/2014, Until Discontinued, Print  Wynetta Emeryicole Dayvon Dax, PA-C 03/21/14 0127

## 2014-03-25 ENCOUNTER — Emergency Department (HOSPITAL_COMMUNITY): Payer: Medicaid Other

## 2014-03-25 ENCOUNTER — Encounter (HOSPITAL_COMMUNITY): Payer: Self-pay | Admitting: Emergency Medicine

## 2014-03-25 ENCOUNTER — Emergency Department (HOSPITAL_COMMUNITY)
Admission: EM | Admit: 2014-03-25 | Discharge: 2014-03-25 | Disposition: A | Discharge status: Home / Self Care | Payer: Medicaid Other | Attending: Emergency Medicine | Admitting: Emergency Medicine

## 2014-03-25 DIAGNOSIS — Z79899 Other long term (current) drug therapy: Secondary | ICD-10-CM | POA: Diagnosis not present

## 2014-03-25 DIAGNOSIS — M549 Dorsalgia, unspecified: Secondary | ICD-10-CM | POA: Diagnosis present

## 2014-03-25 DIAGNOSIS — J45909 Unspecified asthma, uncomplicated: Secondary | ICD-10-CM | POA: Diagnosis not present

## 2014-03-25 DIAGNOSIS — M545 Low back pain, unspecified: Secondary | ICD-10-CM | POA: Diagnosis not present

## 2014-03-25 MED ORDER — TRAMADOL HCL 50 MG PO TABS: 100.0000 mg | ORAL_TABLET | Freq: Four times a day (QID) | ORAL | Status: DC | PRN

## 2014-03-25 MED ORDER — HYDROCODONE-ACETAMINOPHEN 5-325 MG PO TABS
2.0000 | ORAL_TABLET | Freq: Once | ORAL | Status: AC
  Administered 2014-03-25: 2 via ORAL
  Filled 2014-03-25: qty 2

## 2014-03-25 MED ORDER — DIAZEPAM 5 MG PO TABS
5.0000 mg | ORAL_TABLET | Freq: Once | ORAL | Status: AC
  Administered 2014-03-25: 5 mg via ORAL
  Filled 2014-03-25: qty 1

## 2014-03-25 MED ORDER — DIAZEPAM 5 MG PO TABS: 5.0000 mg | ORAL_TABLET | Freq: Two times a day (BID) | ORAL | Status: DC

## 2014-03-25 MED ORDER — METHOCARBAMOL 500 MG PO TABS: 500.0000 mg | ORAL_TABLET | Freq: Two times a day (BID) | ORAL | Status: DC

## 2014-03-25 MED ORDER — IBUPROFEN 400 MG PO TABS
600.0000 mg | ORAL_TABLET | Freq: Once | ORAL | Status: AC
  Administered 2014-03-25: 600 mg via ORAL
  Filled 2014-03-25 (×2): qty 1

## 2014-03-25 NOTE — ED Provider Notes (Signed)
CSN: [id number]     Arrival date & time 03/25/14  0508 History   First MD Initiated Contact with Patient 03/25/14 [phone]     Chief Complaint  Patient presents with  . Back Pain     (Consider location/radiation/quality/duration/timing/severity/associated sxs/prior Treatment) HPI Comments: Pt comes in with cc of back pain. Pt has had this pain for few days now. Pain responds to the meds he gets from the ER, but then returns. Pt works a lot of hours, with lifting. Currently, no associated numbness, weakness, urinary incontinence, urinary retention, bowel incontinence, saddle anesthesia - pt however has had intermittent knee giving out whilst walking.    The history is provided by the patient.    Past Medical History  Diagnosis Date  . Asthma    History reviewed. No pertinent past surgical history. No family history on file. History  Substance Use Topics  . Smoking status: Never Smoker   . Smokeless tobacco: Not on file  . Alcohol Use: No    Review of Systems  Constitutional: Negative for activity change and appetite change.  Respiratory: Negative for cough and shortness of breath.   Cardiovascular: Negative for chest pain.  Gastrointestinal: Negative for abdominal pain.  Genitourinary: Negative for dysuria.  Musculoskeletal: Positive for back pain.      Allergies  Review of patient's allergies indicates no known allergies.  Home Medications   Prior to Admission medications   Medication Sig Start Date End Date Taking? Authorizing Provider  diazepam (VALIUM) 5 MG tablet Take 1 tablet (5 mg total) by mouth 2 (two) times daily. 03/21/14  Yes Nicole Pisciotta, PA-C  ketorolac (TORADOL) 10 MG tablet Take 1 tablet (10 mg total) by mouth every 6 (six) hours as needed (Take with food. Do not take more than 4 per day. Do not take for longer than 5 days). 03/21/14  Yes Nicole Pisciotta, PA-C  methocarbamol (ROBAXIN) 500 MG tablet Take 2 tablets (1,000 mg total) by mouth 4 (four) times  daily. 03/18/14  Yes Renne CriglerJoshua Geiple, PA-C  naproxen (NAPROSYN) 500 MG tablet Take 1 tablet (500 mg total) by mouth 2 (two) times daily. 03/18/14  Yes Renne CriglerJoshua Geiple, PA-C  predniSONE (DELTASONE) 20 MG tablet 3 Tabs PO Days 1-3, then 2 tabs PO Days 4-6, then 1 tab PO Day 7-9, then Half Tab PO Day 10-12 03/18/14  Yes Renne CriglerJoshua Geiple, PA-C  methocarbamol (ROBAXIN) 500 MG tablet Take 1 tablet (500 mg total) by mouth 2 (two) times daily. 03/25/14   Derwood KaplanAnkit Laniece Hornbaker, MD  traMADol (ULTRAM) 50 MG tablet Take 2 tablets (100 mg total) by mouth every 6 (six) hours as needed. 03/25/14   Reva Pinkley Rhunette CroftNanavati, MD   BP 115/76  Pulse 87  Temp(Src) 98 F (36.7 C) (Oral)  Resp 16  Ht 5\' 9"  (1.753 m)  Wt 170 lb (77.111 kg)  BMI 25.09 kg/m2  SpO2 98% Physical Exam  Nursing note and vitals reviewed. Constitutional: He is oriented to person, place, and time. He appears well-developed.  HENT:  Head: Normocephalic and atraumatic.  Eyes: Conjunctivae and EOM are normal. Pupils are equal, round, and reactive to light.  Neck: Normal range of motion. Neck supple.  Cardiovascular: Normal rate and regular rhythm.   Pulmonary/Chest: Effort normal and breath sounds normal.  Abdominal: Soft. Bowel sounds are normal. He exhibits no distension. There is no tenderness. There is no rebound and no guarding.  Musculoskeletal:  Pt has tenderness over the lumbar region No step offs, no erythema. Pt has 2+ patellar  reflex bilaterally - may be mild hyperreflexia Able to discriminate between sharp and dull. Able to ambulate   Neurological: He is alert and oriented to person, place, and time.  Skin: Skin is warm.    ED Course  Procedures (including critical care time) Labs Review Labs Reviewed - No data to display  Imaging Review Dg Lumbar Spine Complete  03/25/2014   CLINICAL DATA:  Lumbar spine tenderness. Recent injury lifting heavy objects  EXAM: LUMBAR SPINE - COMPLETE 4+ VIEW  COMPARISON:  None.  FINDINGS: There is no evidence of  lumbar spine fracture. Alignment is normal. Intervertebral disc spaces are maintained.  IMPRESSION: Negative.   Electronically Signed   By: Tiburcio PeaJonathan  Watts M.D.   On: 03/25/2014 06:38     EKG Interpretation None      MDM   Final diagnoses:  Acute lumbar back pain    DDx includes: - DJD of the back - Spondylitises/ spondylosis - Sciatica - Spinal cord compression - Conus medullaris - Epidural hematoma - Epidural abscess - Lytic/pathologic fracture - Myelitis - Musculoskeletal pain  Pt with back pain - lower, for the past few days. No red flags on hx and exam suggesting any critical etiologies. Gave him [facility]/u this time and neurosurgical f/u, in case his symptoms get worse and he starts having the critical associated symptoms.   Derwood KaplanAnkit Akya Fiorello, MD 03/25/14 [phone]

## 2014-03-25 NOTE — ED Notes (Signed)
Patient presents with c/o lower back pain.  States he has been seen before for the same.  States his back is constantly hurting.  States he noticed his right knee got numb and gave out and he just about fall.

## 2014-03-25 NOTE — ED Notes (Signed)
Patient ambulated to the bathroom stating that he is still hurting as bad as before.

## 2014-03-25 NOTE — Discharge Instructions (Signed)
We saw you in the ER for back pain. The imaging in the exam is normal, and our exam don't indicate any spinal cord involvement - and thus we feel comfortable sending you home.  Please use the back exercises to strengthen the back muscles. CALL [facility] FOR AN APPOINTMENT. USE THE RESOURCES BELOW FOR OTHER PRIMARY CARE OPTIONS.  CALL NEUROSURGEON ONLY IF THERE IS numbness in the legs, weakness in the legs, urinary incontinence, urinary retention, bowel incontinence.     Back Exercises Back exercises help treat and prevent back injuries. The goal of back exercises is to increase the strength of your abdominal and back muscles and the flexibility of your back. These exercises should be started when you no longer have back pain. Back exercises include:  Pelvic Tilt. Lie on your back with your knees bent. Tilt your pelvis until the lower part of your back is against the floor. Hold this position 5 to 10 sec and repeat 5 to 10 times.  Knee to Chest. Pull first 1 knee up against your chest and hold for 20 to 30 seconds, repeat this with the other knee, and then both knees. This may be done with the other leg straight or bent, whichever feels better.  Sit-Ups or Curl-Ups. Bend your knees 90 degrees. Start with tilting your pelvis, and do a partial, slow sit-up, lifting your trunk only 30 to 45 degrees off the floor. Take at least 2 to 3 seconds for each sit-up. Do not do sit-ups with your knees out straight. If partial sit-ups are difficult, simply do the above but with only tightening your abdominal muscles and holding it as directed.  Hip-Lift. Lie on your back with your knees flexed 90 degrees. Push down with your feet and shoulders as you raise your hips a couple inches off the floor; hold for 10 seconds, repeat 5 to 10 times.  Back arches. Lie on your stomach, propping yourself up on bent elbows. Slowly press on your hands, causing an arch in your low back. Repeat 3 to 5 times. Any initial  stiffness and discomfort should lessen with repetition over time.  Shoulder-Lifts. Lie face down with arms beside your body. Keep hips and torso pressed to floor as you slowly lift your head and shoulders off the floor. Do not overdo your exercises, especially in the beginning. Exercises may cause you some mild back discomfort which lasts for a few minutes; however, if the pain is more severe, or lasts for more than 15 minutes, do not continue exercises until you see your caregiver. Improvement with exercise therapy for back problems is slow.  See your caregivers for assistance with developing a proper back exercise program. Document Released: 10/15/2004 Document Revised: 11/30/2011 Document Reviewed: 07/09/2011 [facility] Patient Information 2015 [employer] [place]. This information is not intended to replace advice given to you by your health care provider. Make sure you discuss any questions you have with your health care provider.   Back Pain, Adult Back pain is very common. The pain often gets better over time. The cause of back pain is usually not dangerous. Most people can learn to manage their back pain on their own.  HOME CARE   Stay active. Start with short walks on flat ground if you can. Try to walk farther each day.  Do not sit, drive, or stand in one place for more than 30 minutes. Do not stay in bed.  Do not avoid exercise or work. Activity can help your back heal faster.  Be careful when you bend or lift an object. Bend at your knees, keep the object close to you, and do not twist.  Sleep on a firm mattress. Lie on your side, and bend your knees. If you lie on your back, put a pillow under your knees.  Only take medicines as told by your doctor.  Put ice on the injured area.  Put ice in a plastic bag.  Place a towel between your skin and the bag.  Leave the ice on for 15-20 minutes, 03-04 times a day for the first 2 to 3 days. After that, you can switch between ice and heat  packs.  Ask your doctor about back exercises or massage.  Avoid feeling anxious or stressed. Find good ways to deal with stress, such as exercise. GET HELP RIGHT AWAY IF:   Your pain does not go away with rest or medicine.  Your pain does not go away in 1 week.  You have new problems.  You do not feel well.  The pain spreads into your legs.  You cannot control when you poop (bowel movement) or pee (urinate).  Your arms or legs feel weak or lose feeling (numbness).  You feel sick to your stomach (nauseous) or throw up (vomit).  You have belly (abdominal) pain.  You feel like you may pass out (faint). MAKE SURE YOU:   Understand these instructions.  Will watch your condition.  Will get help right away if you are not doing well or get worse. Document Released: 02/24/2008 Document Revised: 11/30/2011 Document Reviewed: 01/26/2011 [facility] Patient Information 2015 [place], [place]. This information is not intended to replace advice given to you by your health care provider. Make sure you discuss any questions you have with your health care provider.   RESOURCE GUIDE  Chronic Pain Problems: Contact [facility]  [phone] Patients need to be referred by their primary care doctor.  Insufficient Money for Medicine: Contact United Way:  call "211."   No Primary Care Doctor: - Call Health Connect  [phone] - can help you locate a primary care doctor that  accepts your insurance, provides certain services, etc. - Physician Referral Service- [phone]  Agencies that provide inexpensive medical care: - [facility]  [phone] - [facility]  [phone] - [facility]  [phone] - Women's Clinic  [phone] - [facility] Parenthood  [phone] [facility]  [phone]  Medicaid-accepting [facility] Providers: - [facility]- [address], [address]  [phone], Mon-Fri 9am-7pm, Sat  9am-1pm - [facility]- [address], [address] [place]  [phone] - [facility]- [address], [address] [place]  [phone] [facility]- [address]  [phone] - Renaye RakersVeita Bland- [address], [address], [phone]  Only accepts [place] patients after they have their name  applied to their card  Self Pay (no insurance) in [place]: - Sickle Cell Patients: Dr Willey BladeEric Dean, [facility]  [address], [phone] - [facility]- [address]  [phone]       Redge Gainer-     [facility]- [address]       -     [facility]- see information above (Speak to CitigroupPam H if you do not have insurance)       -  [facility]- [address],  [phone]       -  [facility]- [address], [phone]       -  Dr Julio Sicks-  [address], [address], [place], [phone]       -  [facility] - [address] [phone]       -  [facility]- [address], [phone], also [address], [phone]       -    [facility]- [address] [place], [phone], 1st & 3rd Saturday        every month, 10am-1pm  [facility] [address] [place], [place] [address] [phone]  [facility] [address] [place], [place] [address] [phone]  1) Find a Doctor and Pay Out of Pocket Although you won't have to find out who is covered by your insurance plan, it is a good idea to ask around and get recommendations. You will then need to call the office and see if the doctor you have chosen will accept you as a new patient and what types of options they offer for patients who are self-pay. Some doctors offer discounts or will set up payment plans for their patients who do not have insurance, but you will need to ask so you aren't  surprised when you get to your appointment.  2) Contact Your Local Health Department Not all health departments have doctors that can see patients for sick visits, but many do, so it is worth a call to see if yours does. If you don't know where your local health department is, you can check in your phone book. The CDC also has a tool to help you locate your state's health department, and many state websites also have listings of all of their local health departments.  3) Find a Walk-in Clinic If your illness is not likely to be very severe or complicated, you may want to try a walk in clinic. These are popping up all over the country in pharmacies, drugstores, and shopping centers. They're usually staffed by nurse practitioners or physician assistants that have been trained to treat common illnesses and complaints. They're usually fairly quick and inexpensive. However, if you have serious medical issues or chronic medical problems, these are probably not your best option  STD Testing - [facility] Department of [facility] [place], STD Clinic, [address] [place], phone [phone] or [phone].  Monday - Friday, call for an appointment. [facility] Department of [employer], STD Clinic, [address], [place], phone [phone] or [phone].  Monday - Friday, call for an appointment.  Abuse/Neglect: [facility] Child Abuse Hotline 339 172 0358 [facility] Child Abuse Hotline 863-682-1425 (After Hours)  Emergency Shelter:  [facility] [phone]  Maternity Homes: - [facility] [phone] - [facility] [phone]  MRSA Hotline #:   346-459-1524  Dental Assistance If unable to pay or uninsured, contact:  [facility]. to become qualified for the adult dental clinic.  Patients with Medicaid: [facility] [phone],  [phone] [address], [phone]  If unable to pay, or uninsured, contact [facility] [phone] in [place], [phone] in [facility]) to become qualified for the adult dental clinic  [facility] [phone]  [address] [place], [place] [address] [phone] [email or web address]  Other Proofreader Services: - [facility]- [address] [place], [place], [place], [address], [phone], Ext. [phone], 2nd and 4th Thursday of the month at 6:30am.  10 clients each day by appointment, can sometimes see walk-in patients if someone does not show for an appointment. [facility]- [address] [place], [place], [address], [phone] - [facility]- [address], [place], [place], [address], [phone] - [facility]- [phone] [facility]- [phone] [facility] Department- [phone]

## 2014-03-25 NOTE — ED Notes (Signed)
The  Pt is c/o lower back pain for years.  He has had more pain for the past month

## 2014-03-27 NOTE — ED Provider Notes (Signed)
Medical screening examination/treatment/procedure(s) were performed by non-physician practitioner and as supervising physician I was immediately available for consultation/collaboration.   EKG Interpretation None        Ynez Eugenio, MD 03/27/14 0315 

## 2014-05-25 ENCOUNTER — Encounter (HOSPITAL_COMMUNITY): Payer: Self-pay | Admitting: Emergency Medicine

## 2014-05-25 ENCOUNTER — Emergency Department (HOSPITAL_COMMUNITY): Payer: Medicaid Other

## 2014-05-25 ENCOUNTER — Emergency Department (HOSPITAL_COMMUNITY)
Admission: EM | Admit: 2014-05-25 | Discharge: 2014-05-26 | Disposition: A | Discharge status: Home / Self Care | Payer: Medicaid Other | Attending: Emergency Medicine | Admitting: Emergency Medicine

## 2014-05-25 DIAGNOSIS — Y9389 Activity, other specified: Secondary | ICD-10-CM | POA: Diagnosis not present

## 2014-05-25 DIAGNOSIS — S335XXA Sprain of ligaments of lumbar spine, initial encounter: Secondary | ICD-10-CM | POA: Insufficient documentation

## 2014-05-25 DIAGNOSIS — X500XXA Overexertion from strenuous movement or load, initial encounter: Secondary | ICD-10-CM | POA: Insufficient documentation

## 2014-05-25 DIAGNOSIS — R1013 Epigastric pain: Secondary | ICD-10-CM | POA: Diagnosis not present

## 2014-05-25 DIAGNOSIS — R05 Cough: Secondary | ICD-10-CM

## 2014-05-25 DIAGNOSIS — R040 Epistaxis: Secondary | ICD-10-CM

## 2014-05-25 DIAGNOSIS — R059 Cough, unspecified: Secondary | ICD-10-CM | POA: Diagnosis present

## 2014-05-25 DIAGNOSIS — Y929 Unspecified place or not applicable: Secondary | ICD-10-CM | POA: Insufficient documentation

## 2014-05-25 DIAGNOSIS — S39012A Strain of muscle, fascia and tendon of lower back, initial encounter: Secondary | ICD-10-CM

## 2014-05-25 LAB — COMPREHENSIVE METABOLIC PANEL
ALK PHOS: 43 U/L (ref 39–117)
ALT: 17 U/L (ref 0–53)
AST: 25 U/L (ref 0–37)
Albumin: 4.4 g/dL (ref 3.5–5.2)
Anion gap: 11 (ref 5–15)
BILIRUBIN TOTAL: 0.8 mg/dL (ref 0.3–1.2)
BUN: 12 mg/dL (ref 6–23)
CALCIUM: 9.6 mg/dL (ref 8.4–10.5)
CHLORIDE: 102 meq/L (ref 96–112)
CO2: 25 meq/L (ref 19–32)
Creatinine, Ser: 1.02 mg/dL (ref 0.50–1.35)
GFR calc Af Amer: >90 mL/min (ref >90)
GFR calc non Af Amer: >90 mL/min (ref >90)
GLUCOSE: 101 mg/dL — AB (ref 70–99)
POTASSIUM: 3.9 meq/L (ref 3.7–5.3)
SODIUM: 138 meq/L (ref 137–147)
Total Protein: 7.2 g/dL (ref 6.0–8.3)

## 2014-05-25 LAB — CBC WITH DIFFERENTIAL/PLATELET
BASOS ABS: 0 10*3/uL (ref 0.0–0.1)
Basophils Relative: 1 % (ref 0–1)
Eosinophils Absolute: 0.2 10*3/uL (ref 0.0–0.7)
Eosinophils Relative: 3 % (ref 0–5)
HCT: 41.2 % (ref 39.0–52.0)
Hemoglobin: 13.4 g/dL (ref 13.0–17.0)
LYMPHS ABS: 2.1 10*3/uL (ref 0.7–4.0)
LYMPHS PCT: 34 % (ref 12–46)
MCH: 26.7 pg (ref 26.0–34.0)
MCHC: 32.5 g/dL (ref 30.0–36.0)
MCV: 82.1 fL (ref 78.0–100.0)
Monocytes Absolute: 0.4 10*3/uL (ref 0.1–1.0)
Monocytes Relative: 6 % (ref 3–12)
NEUTROS PCT: 58 % (ref 43–77)
Neutro Abs: 3.5 10*3/uL (ref 1.7–7.7)
Platelets: 256 10*3/uL (ref 150–400)
RBC: 5.02 MIL/uL (ref 4.22–5.81)
RDW: 13.1 % (ref 11.5–15.5)
WBC: 6.1 10*3/uL (ref 4.0–10.5)

## 2014-05-25 NOTE — ED Notes (Signed)
The pt basically wants a check up.  He is c/o joint pain.  He does not have a regular doctor

## 2014-05-25 NOTE — ED Notes (Signed)
The pt is c/o multiple symptoms.  He is sob chest congestion nose bleeds since he hass been working in this [employer], [employer].  He has been coughing and he coughed up blood last pm.  C/o chest pain also

## 2014-05-26 MED ORDER — CYCLOBENZAPRINE HCL 10 MG PO TABS: 10.0000 mg | ORAL_TABLET | Freq: Two times a day (BID) | ORAL | Status: AC | PRN

## 2014-05-26 MED ORDER — FAMOTIDINE 20 MG PO TABS: 20.0000 mg | ORAL_TABLET | Freq: Two times a day (BID) | ORAL | Status: AC

## 2014-05-26 MED ORDER — ACETAMINOPHEN 500 MG PO TABS
1000.0000 mg | ORAL_TABLET | Freq: Once | ORAL | Status: DC
  Filled 2014-05-26: qty 2

## 2014-05-26 MED ORDER — TRAMADOL HCL 50 MG PO TABS
50.0000 mg | ORAL_TABLET | Freq: Once | ORAL | Status: AC
  Administered 2014-05-26: 50 mg via ORAL
  Filled 2014-05-26: qty 1

## 2014-05-26 MED ORDER — FAMOTIDINE 20 MG PO TABS
20.0000 mg | ORAL_TABLET | Freq: Once | ORAL | Status: AC
  Administered 2014-05-26: 20 mg via ORAL
  Filled 2014-05-26: qty 1

## 2014-05-26 MED ORDER — CYCLOBENZAPRINE HCL 5 MG PO TABS
7.5000 mg | ORAL_TABLET | Freq: Once | ORAL | Status: AC
  Administered 2014-05-26: 7.5 mg via ORAL
  Filled 2014-05-26: qty 1.5

## 2014-05-26 NOTE — Discharge Instructions (Signed)
Please use proper lifting techniques at work. Use ice or heat as needed for your back discomfort. Use Tylenol and Flexeril for back spasms and pain.  If you were given medicines take as directed.  If you are on coumadin or contraceptives realize their levels and effectiveness is altered by many different medicines.  If you have any reaction (rash, tongues swelling, other) to the medicines stop taking and see a physician.   Please follow up as directed and return to the ER or see a physician for new or worsening symptoms.  Thank you. Filed Vitals:   05/25/14 2049 05/26/14 0026  BP: 115/64 119/75  Pulse: 100 66  Temp: 98.5 F (36.9 C) 97.9 F (36.6 C)  TempSrc: Oral Oral  Resp: 18 16  SpO2: 98% 100%

## 2014-05-26 NOTE — ED Provider Notes (Signed)
CSN: [id number]     Arrival date & time 05/25/14  2036 History   First MD Initiated Contact with Patient 05/25/14 2349     Chief Complaint  Patient presents with  . Cough  . Epistaxis  . Generalized Body Aches     (Consider location/radiation/quality/duration/timing/severity/associated sxs/prior Treatment) HPI Comments: 23 year old male with smoking and asthma history presents with multiple symptoms. Patient has had cough and chest congestion with intermittent epistaxis the past few days. 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 Patient had a lot of blood in his nares and coughing up blood. No fevers or chills. Patient has mild bodyaches however mostly its wrist pain knee pain from lifting heavy painful day. Patient also has lower back pain worse with movement patient has had this for months. Patient only has 3 days off a month. No neuro symptoms.  Patient is a 23 y.o. male presenting with cough and nosebleeds. The history is provided by the patient.  Cough Associated symptoms: no chest pain, no chills, no fever, no headaches, no rash and no shortness of breath   Epistaxis Associated symptoms: cough   Associated symptoms: no congestion, no fever and no headaches     Past Medical History  Diagnosis Date  . Asthma    History reviewed. No pertinent past surgical history. No family history on file. History  Substance Use Topics  . Smoking status: Never Smoker   . Smokeless tobacco: Not on file  . Alcohol Use: No    Review of Systems  Constitutional: Negative for fever and chills.  HENT: Positive for nosebleeds. Negative for congestion.   Eyes: Negative for visual disturbance.  Respiratory: Positive for cough. Negative for shortness of breath.   Cardiovascular: Negative for chest pain.  Gastrointestinal: Positive for abdominal pain (epigastric mild). Negative for vomiting.  Genitourinary: Negative for dysuria and flank pain.  Musculoskeletal: Positive for  arthralgias. Negative for back pain, neck pain and neck stiffness.  Skin: Negative for rash.  Neurological: Negative for light-headedness and headaches.      Allergies  Review of patient's allergies indicates no known allergies.  Home Medications   Prior to Admission medications   Medication Sig Start Date End Date Taking? Authorizing Provider  diazepam (VALIUM) 5 MG tablet Take 1 tablet (5 mg total) by mouth 2 (two) times daily. 03/21/14  Yes Dustin Pisciotta, PA-C   BP 115/64  Pulse 100  Temp(Src) 98.5 F (36.9 C) (Oral)  Resp 18  SpO2 98% Physical Exam  Nursing note and vitals reviewed. Constitutional: He is oriented to person, place, and time. He appears well-developed and well-nourished.  HENT:  Head: Normocephalic and atraumatic.  No hematoma or epistaxis at this time. Posterior pharynx clear.  Eyes: Conjunctivae are normal. Right eye exhibits no discharge. Left eye exhibits no discharge.  Neck: Normal range of motion. Neck supple. No tracheal deviation present.  Cardiovascular: Normal rate and regular rhythm.   Pulmonary/Chest: Effort normal and breath sounds normal.  Abdominal: Soft. He exhibits no distension. There is no tenderness. There is no guarding.  Musculoskeletal: He exhibits no edema.  Mild paraspinal lumbar tenderness and tight musculature in the right no midline tenderness  Neurological: He is alert and oriented to person, place, and time.  5+ lower extremity is bilateral sensation intact lower extremity bilateral  Skin: Skin is warm. No rash noted.  Psychiatric: He has a normal mood and affect.    ED Course  Procedures (including critical care time) Labs Review Labs Reviewed  COMPREHENSIVE METABOLIC PANEL - Abnormal; Notable for the following:    Glucose, Bld 101 (*)    All other components within normal limits  CBC WITH DIFFERENTIAL    Imaging Review Dg Chest 2 View  05/25/2014   CLINICAL DATA:  Chest pain, coughing dizziness since yesterday.  Coughing up blood. Heartburn.  EXAM: CHEST  2 VIEW  COMPARISON:  None.  FINDINGS: The heart size and mediastinal contours are within normal limits. Both lungs are clear. The visualized skeletal structures are unremarkable.  IMPRESSION: No active cardiopulmonary disease.   Electronically Signed   By: Burman Nieves M.D.   On: 05/25/2014 22:32     EKG Interpretation None      MDM   Final diagnoses:  Cough  Epistaxis  Epigastric pain  Lumbar strain, initial encounter   Well-appearing overall healthy male except for asthma and smoking presents with multiple symptoms. Epistaxis recurrent, patient very low risk blood thinners, no bleeding in ER. No treatment required at this time. Lower back pain paraspinal, worse with movement, neuro intact. Musculoskeletal, muscle relaxant and supportive care discussed. Chest congestion, chest x-ray unremarkable, vitals unremarkable. Followup outpatient, patient requesting work note.  Results and differential diagnosis were discussed with the patient/parent/guardian. Close follow up outpatient was discussed, comfortable with the plan.   Medications  acetaminophen (TYLENOL) tablet 1,000 mg (1,000 mg Oral Not Given 05/26/14 0029)  cyclobenzaprine (FLEXERIL) tablet 7.5 mg (not administered)  traMADol (ULTRAM) tablet 50 mg (not administered)  famotidine (PEPCID) tablet 20 mg (not administered)    Filed Vitals:   05/25/14 2049 05/26/14 0026  BP: 115/64 119/75  Pulse: 100 66  Temp: 98.5 F (36.9 C) 97.9 F (36.6 C)  TempSrc: Oral Oral  Resp: 18 16  SpO2: 98% 100%        Enid Skeens, MD 05/26/14 [phone]

## 2014-11-23 ENCOUNTER — Emergency Department (HOSPITAL_COMMUNITY)
Admission: EM | Admit: 2014-11-23 | Discharge: 2014-11-24 | Disposition: A | Discharge status: Home / Self Care | Payer: Medicaid Other | Attending: Emergency Medicine | Admitting: Emergency Medicine

## 2014-11-23 ENCOUNTER — Emergency Department (HOSPITAL_COMMUNITY): Payer: Medicaid Other

## 2014-11-23 ENCOUNTER — Encounter (HOSPITAL_COMMUNITY): Payer: Self-pay | Admitting: Emergency Medicine

## 2014-11-23 DIAGNOSIS — Y92410 Unspecified street and highway as the place of occurrence of the external cause: Secondary | ICD-10-CM | POA: Diagnosis not present

## 2014-11-23 DIAGNOSIS — S199XXA Unspecified injury of neck, initial encounter: Secondary | ICD-10-CM | POA: Diagnosis not present

## 2014-11-23 DIAGNOSIS — M791 Myalgia, unspecified site: Secondary | ICD-10-CM

## 2014-11-23 DIAGNOSIS — S8992XA Unspecified injury of left lower leg, initial encounter: Secondary | ICD-10-CM | POA: Insufficient documentation

## 2014-11-23 DIAGNOSIS — J45909 Unspecified asthma, uncomplicated: Secondary | ICD-10-CM | POA: Insufficient documentation

## 2014-11-23 DIAGNOSIS — S3992XA Unspecified injury of lower back, initial encounter: Secondary | ICD-10-CM | POA: Diagnosis not present

## 2014-11-23 DIAGNOSIS — S8991XA Unspecified injury of right lower leg, initial encounter: Secondary | ICD-10-CM | POA: Insufficient documentation

## 2014-11-23 DIAGNOSIS — Y998 Other external cause status: Secondary | ICD-10-CM | POA: Insufficient documentation

## 2014-11-23 DIAGNOSIS — M25561 Pain in right knee: Secondary | ICD-10-CM

## 2014-11-23 DIAGNOSIS — Y9389 Activity, other specified: Secondary | ICD-10-CM | POA: Insufficient documentation
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
## 2014-11-24 ENCOUNTER — Emergency Department (HOSPITAL_COMMUNITY): Payer: Medicaid Other

## 2014-11-24 NOTE — Discharge Instructions (Signed)
Please call your doctor for a followup appointment within 24-48 hours. When you talk to your doctor please let them know that you were seen in the emergency department and have them acquire all of your records so that they can discuss the findings with you and formulate a treatment plan to fully care for your new and ongoing problems. Please follow-up with primary care provider Please follow-up with orthopedics Please rest and stay hydrated Please apply icy hot ointment and massage Please avoid any physical or strenuous activity Please continue to monitor symptoms closely and if symptoms are to worsen or change (fever greater than 101, chills, sweating, nausea, vomiting, chest pain, shortness of breathe, difficulty breathing, weakness, numbness, tingling, worsening or changes to pain pattern, fall, headache, dizziness, loss of sensation to arms, blurred vision or sudden loss of vision) please report back to the Emergency Department immediately.   Muscle Pain Muscle pain (myalgia) may be caused by many things, including:  Overuse or muscle strain, especially if you are not in shape. This is the most common cause of muscle pain.  Injury.  Bruises.  Viruses, such as the flu.  Infectious diseases.  Fibromyalgia, which is a chronic condition that causes muscle tenderness, fatigue, and headache.  Autoimmune diseases, including lupus.  Certain drugs, including ACE inhibitors and statins. Muscle pain may be mild or severe. In most cases, the pain lasts only a short time and goes away without treatment. To diagnose the cause of your muscle pain, your health care provider will take your medical history. This means he or she will ask you when your muscle pain began and what has been happening. If you have not had muscle pain for very long, your health care provider may want to wait before doing much testing. If your muscle pain has lasted a long time, your health care provider may want to run tests  right away. If your health care provider thinks your muscle pain may be caused by illness, you may need to have additional tests to rule out certain conditions.  Treatment for muscle pain depends on the cause. Home care is often enough to relieve muscle pain. Your health care provider may also prescribe anti-inflammatory medicine. HOME CARE INSTRUCTIONS Watch your condition for any changes. The following actions may help to lessen any discomfort you are feeling:  Only take over-the-counter or prescription medicines as directed by your health care provider.  Apply ice to the sore muscle:  Put ice in a plastic bag.  Place a towel between your skin and the bag.  Leave the ice on for 15-20 minutes, 3-4 times a day.  You may alternate applying hot and cold packs to the muscle as directed by your health care provider.  If overuse is causing your muscle pain, slow down your activities until the pain goes away.  Remember that it is normal to feel some muscle pain after starting a workout program. Muscles that have not been used often will be sore at first.  Do regular, gentle exercises if you are not usually active.  Warm up before exercising to lower your risk of muscle pain.  Do not continue working out if the pain is very bad. Bad pain could mean you have injured a muscle. SEEK MEDICAL CARE IF:  Your muscle pain gets worse, and medicines do not help.  You have muscle pain that lasts longer than 3 days.  You have a rash or fever along with muscle pain.  You have muscle pain after  a tick bite.  You have muscle pain while working out, even though you are in good physical condition.  You have redness, soreness, or swelling along with muscle pain.  You have muscle pain after starting a new medicine or changing the dose of a medicine. SEEK IMMEDIATE MEDICAL CARE IF:  You have trouble breathing.  You have trouble swallowing.  You have muscle pain along with a stiff neck, fever, and  vomiting.  You have severe muscle weakness or cannot move part of your body. MAKE SURE YOU:   Understand these instructions.  Will watch your condition.  Will get help right away if you are not doing well or get worse. Document Released: 07/30/2006 Document Revised: 09/12/2013 Document Reviewed: 07/04/2013 [facility] Patient Information 2015 [place], [place]. This information is not intended to replace advice given to you by your health care provider. Make sure you discuss any questions you have with your health care provider. Knee Pain The knee is the complex joint between your thigh and your lower leg. It is made up of bones, tendons, ligaments, and cartilage. The bones that make up the knee are:  The femur in the thigh.  The tibia and fibula in the lower leg.  The patella or kneecap riding in the groove on the lower femur. CAUSES  Knee pain is a common complaint with many causes. A few of these causes are:  Injury, such as:  A ruptured ligament or tendon injury.  Torn cartilage.  Medical conditions, such as:  Gout  Arthritis  Infections  Overuse, over training, or overdoing a physical activity. Knee pain can be minor or severe. Knee pain can accompany debilitating injury. Minor knee problems often respond well to self-care measures or get well on their own. More serious injuries may need medical intervention or even surgery. SYMPTOMS The knee is complex. Symptoms of knee problems can vary widely. Some of the problems are:  Pain with movement and weight bearing.  Swelling and tenderness.  Buckling of the knee.  Inability to straighten or extend your knee.  Your knee locks and you cannot straighten it.  Warmth and redness with pain and fever.  Deformity or dislocation of the kneecap. DIAGNOSIS  Determining what is wrong may be very straight forward such as when there is an injury. It can also be challenging because of the complexity of the knee. Tests to make a  diagnosis may include:  Your caregiver taking a history and doing a physical exam.  Routine X-rays can be used to rule out other problems. X-rays will not reveal a cartilage tear. Some injuries of the knee can be diagnosed by:  Arthroscopy a surgical technique by which a small video camera is inserted through tiny incisions on the sides of the knee. This procedure is used to examine and repair internal knee joint problems. Tiny instruments can be used during arthroscopy to repair the torn knee cartilage (meniscus).  Arthrography is a radiology technique. A contrast liquid is directly injected into the knee joint. Internal structures of the knee joint then become visible on X-ray film.  An MRI scan is a non X-ray radiology procedure in which magnetic fields and a computer produce two- or three-dimensional images of the inside of the knee. Cartilage tears are often visible using an MRI scanner. MRI scans have largely replaced arthrography in diagnosing cartilage tears of the knee.  Blood work.  Examination of the fluid that helps to lubricate the knee joint (synovial fluid). This is done by taking  a sample out using a needle and a syringe. TREATMENT The treatment of knee problems depends on the cause. Some of these treatments are:  Depending on the injury, proper casting, splinting, surgery, or physical therapy care will be needed.  Give yourself adequate recovery time. Do not overuse your joints. If you begin to get sore during workout routines, back off. Slow down or do fewer repetitions.  For repetitive activities such as cycling or running, maintain your strength and nutrition.  Alternate muscle groups. For example, if you are a weight lifter, work the upper body on one day and the lower body the next.  Either tight or weak muscles do not give the proper support for your knee. Tight or weak muscles do not absorb the stress placed on the knee joint. Keep the muscles surrounding the knee  strong.  Take care of mechanical problems.  If you have flat feet, orthotics or special shoes may help. See your caregiver if you need help.  Arch supports, sometimes with wedges on the inner or outer aspect of the heel, can help. These can shift pressure away from the side of the knee most bothered by osteoarthritis.  A brace called an "unloader" brace also may be used to help ease the pressure on the most arthritic side of the knee.  If your caregiver has prescribed crutches, braces, wraps or ice, use as directed. The acronym for this is PRICE. This means protection, rest, ice, compression, and elevation.  Nonsteroidal anti-inflammatory drugs (NSAIDs), can help relieve pain. But if taken immediately after an injury, they may actually increase swelling. Take NSAIDs with food in your stomach. Stop them if you develop stomach problems. Do not take these if you have a history of ulcers, stomach pain, or bleeding from the bowel. Do not take without your caregiver's approval if you have problems with fluid retention, heart failure, or kidney problems.  For ongoing knee problems, physical therapy may be helpful.  Glucosamine and chondroitin are over-the-counter dietary supplements. Both may help relieve the pain of osteoarthritis in the knee. These medicines are different from the usual anti-inflammatory drugs. Glucosamine may decrease the rate of cartilage destruction.  Injections of a corticosteroid drug into your knee joint may help reduce the symptoms of an arthritis flare-up. They may provide pain relief that lasts a few months. You may have to wait a few months between injections. The injections do have a small increased risk of infection, water retention, and elevated blood sugar levels.  Hyaluronic acid injected into damaged joints may ease pain and provide lubrication. These injections may work by reducing inflammation. A series of shots may give relief for as long as 6 months.  Topical  painkillers. Applying certain ointments to your skin may help relieve the pain and stiffness of osteoarthritis. Ask your pharmacist for suggestions. Many over the-counter products are approved for temporary relief of arthritis pain.  In some countries, doctors often prescribe topical NSAIDs for relief of chronic conditions such as arthritis and tendinitis. A review of treatment with NSAID creams found that they worked as well as oral medications but without the serious side effects. PREVENTION  Maintain a healthy weight. Extra pounds put more strain on your joints.  Get strong, stay limber. Weak muscles are a common cause of knee injuries. Stretching is important. Include flexibility exercises in your workouts.  Be smart about exercise. If you have osteoarthritis, chronic knee pain or recurring injuries, you may need to change the way you exercise. This does  not mean you have to stop being active. If your knees ache after jogging or playing basketball, consider switching to swimming, water aerobics, or other low-impact activities, at least for a few days a week. Sometimes limiting high-impact activities will provide relief.  Make sure your shoes fit well. Choose footwear that is right for your sport.  Protect your knees. Use the proper gear for knee-sensitive activities. Use kneepads when playing volleyball or laying carpet. Buckle your seat belt every time you drive. Most shattered kneecaps occur in car accidents.  Rest when you are tired. SEEK MEDICAL CARE IF:  You have knee pain that is continual and does not seem to be getting better.  SEEK IMMEDIATE MEDICAL CARE IF:  Your knee joint feels hot to the touch and you have a high fever. MAKE SURE YOU:   Understand these instructions.  Will watch your condition.  Will get help right away if you are not doing well or get worse. Document Released: 07/05/2007 Document Revised: 11/30/2011 Document Reviewed: 07/05/2007 [facility] Patient  Information 2015 [place], [place]. This information is not intended to replace advice given to you by your health care provider. Make sure you discuss any questions you have with your health care provider.

## 2018-04-05 ENCOUNTER — Emergency Department (HOSPITAL_COMMUNITY): Payer: Self-pay

## 2018-04-05 ENCOUNTER — Encounter (HOSPITAL_COMMUNITY): Payer: Self-pay

## 2018-04-05 ENCOUNTER — Other Ambulatory Visit: Payer: Self-pay

## 2018-04-05 ENCOUNTER — Emergency Department (HOSPITAL_COMMUNITY)
Admission: EM | Admit: 2018-04-05 | Discharge: 2018-04-05 | Disposition: A | Discharge status: Home / Self Care | Payer: Self-pay | Attending: Emergency Medicine | Admitting: Emergency Medicine

## 2018-04-05 DIAGNOSIS — J45909 Unspecified asthma, uncomplicated: Secondary | ICD-10-CM | POA: Insufficient documentation

## 2018-04-05 DIAGNOSIS — M25512 Pain in left shoulder: Secondary | ICD-10-CM | POA: Insufficient documentation

## 2018-04-05 MED ORDER — HYDROCODONE-ACETAMINOPHEN 5-325 MG PO TABS
1.0000 | ORAL_TABLET | Freq: Once | ORAL | Status: AC
  Administered 2018-04-05: 1 via ORAL
  Filled 2018-04-05: qty 1

## 2018-04-05 MED ORDER — METHOCARBAMOL 500 MG PO TABS: 500.0000 mg | ORAL_TABLET | Freq: Two times a day (BID) | ORAL | 0 refills | Status: AC

## 2018-04-05 NOTE — ED Triage Notes (Signed)
Pt presents with 1 week h/o L shoulder pain.   Pt reports throwing a hook on a bag with onset of pain in shoulder that has gradually worsened.  Pt unable to lift arm up to 90 degrees.

## 2018-04-05 NOTE — Discharge Instructions (Signed)
You can take Tylenol or Ibuprofen as directed for pain. You can alternate Tylenol and Ibuprofen every 4 hours. If you take Tylenol at 1pm, then you can take Ibuprofen at 5pm. Then you can take Tylenol again at 9pm.   Take Robaxin as prescribed. This medication will make you drowsy so do not drive or drink alcohol when taking it.  The shoulder sling for support and stabilization.  Do not wear it 24/7.  He should take the arm out of the sling occasionally do gentle range of motion exercise shoulder syndrome.  As we discussed, please follow-up with referred orthopedic doctor for further evaluation.  Return the emergency department for any acute pain, redness or swelling of the arm, numbness/weakness or any other worsening or concerning symptoms.

## 2018-04-05 NOTE — ED Provider Notes (Signed)
[name] EMERGENCY DEPARTMENT Provider Note   CSN: [id number] Arrival date & time: 04/05/18  1605     History   Chief Complaint Chief Complaint  Patient presents with  . Shoulder Pain    HPI Dustin Haley is a 27 y.o. male presents for evaluation of 1 week of progressive worsening left shoulder pain.  Patient reports that prior to onset of symptoms, he was boxing and sparring with a partner and states that he hit the left hook.  He reports at that time he had some soreness to the area but states he was able to continue working out.  Patient reports the next day, he had worsening pain of his left shoulder and had limited range of motion.  Patient reports he has been intermittently taking Tylenol otherwise has not taken any other medication.  Patient reports limited range of motion secondary to pain.  Denies any other preceding trauma, injury, fall.  Patient denies any numbness/weakness.   The history is provided by the patient.    Past Medical History:  Diagnosis Date  . Asthma     There are no active problems to display for this patient.   History reviewed. No pertinent surgical history.      Home Medications    Prior to Admission medications   Medication Sig Start Date End Date Taking? Authorizing Provider  cyclobenzaprine (FLEXERIL) 10 MG tablet Take 1 tablet (10 mg total) by mouth 2 (two) times daily as needed for muscle spasms. Patient not taking: Reported on 11/23/2014 05/26/14   Blane Ohara, MD  diazepam (VALIUM) 5 MG tablet Take 1 tablet (5 mg total) by mouth 2 (two) times daily. Patient not taking: Reported on 11/23/2014 03/21/14   Pisciotta, Joni Reining, PA-C  famotidine (PEPCID) 20 MG tablet Take 1 tablet (20 mg total) by mouth 2 (two) times daily. Patient not taking: Reported on 11/23/2014 05/26/14   Blane Ohara, MD  methocarbamol (ROBAXIN) 500 MG tablet Take 1 tablet (500 mg total) by mouth 2 (two) times daily. 04/05/18   Maxwell Caul, PA-C      Family History History reviewed. No pertinent family history.  Social History Social History   Tobacco Use  . Smoking status: Never Smoker  . Smokeless tobacco: Never Used  Substance Use Topics  . Alcohol use: No  . Drug use: No     Allergies   Patient has no known allergies.   Review of Systems Review of Systems  Musculoskeletal:       Left Shoulder pain  Neurological: Negative for weakness and numbness.     Physical Exam Updated Vital Signs BP (!) 127/94 (BP Location: Right Arm)   Pulse 98   Temp 99.7 F (37.6 C) (Oral)   Resp 18   Ht 5\' 9"  (1.753 m)   Wt 72.6 kg (160 lb)   SpO2 98%   BMI 23.63 kg/m   Physical Exam  Constitutional: He appears well-developed and well-nourished.  HENT:  Head: Normocephalic and atraumatic.  Eyes: Conjunctivae and EOM are normal. Right eye exhibits no discharge. Left eye exhibits no discharge. No scleral icterus.  Cardiovascular:  Pulses:      Radial pulses are 2+ on the right side, and 2+ on the left side.  Pulmonary/Chest: Effort normal.  Musculoskeletal:  Tenderness palpation noted to the anterior aspect of the left shoulder that extends distally.  No deformity or crepitus noted.  Bilateral clavicles are symmetric in appearance.  Compartments are soft.  No tenderness palpation  noted to left elbow, left wrist, left hand.  Patient can move all 5 digits of the left hand without any difficulty.  Patient has approximately 90 degrees of active abduction of left shoulder.  Passive abduction, he is able to achieve approximate 110-120 reporting significant pain.  Negative Neer's impingement, [place].  Limited liftoff test.  Positive's empty can test.  No abnormalities of the right upper extremity.  Full range of motion of right upper extremity without any difficulty.  Neurological: He is alert.  Sensation intact along major nerve distributions of BUE  Skin: Skin is warm and dry. Capillary refill takes less than 2 seconds.  Good  distal cap refill. LUE is not dusky in appearance or cool to touch.  Psychiatric: He has a normal mood and affect. His speech is normal and behavior is normal.  Nursing note and vitals reviewed.    ED Treatments / Results  Labs (all labs ordered are listed, but only abnormal results are displayed) Labs Reviewed - No data to display  EKG None  Radiology Dg Shoulder Left  Result Date: 04/05/2018 CLINICAL DATA:  Anterior left shoulder pain and limited range of motion for 1 week. Boxing injury. Initial encounter. EXAM: LEFT SHOULDER - 2+ VIEW COMPARISON:  None. FINDINGS: There is no evidence of fracture or dislocation. There is no evidence of arthropathy or other focal bone abnormality. Soft tissues are unremarkable. IMPRESSION: Negative. Electronically Signed   By: Sebastian AcheAllen  Grady M.D.   On: 04/05/2018 18:00    Procedures Procedures (including critical care time)  Medications Ordered in ED Medications  HYDROcodone-acetaminophen (NORCO/VICODIN) 5-325 MG per tablet 1 tablet (1 tablet Oral Given 04/05/18 1827)     Initial Impression / Assessment and Plan / ED Course  I have reviewed the triage vital signs and the nursing notes.  Pertinent labs & imaging results that were available during my care of the patient were reviewed by me and considered in my medical decision making (see chart for details).     27 year old male who presents for evaluation of 1 week of progressively left shoulder pain.  Reports this occurred after he was boxing.  No other preceding trauma, injury, fall.  No numbness/weakness. Patient is afebrile, non-toxic appearing, sitting comfortably on examination table. Vital signs reviewed and stable. Patient is neurovascularly intact.  Consider muscle strain versus ligament sprain versus tendon or muscle injury vs rotator cuff injury.  Also consider fracture versus dislocation but low suspicion.  History/physical exam is not concerning for compartment syndrome, DVT of upper  extremity, acute arterial embolism, septic arthritis.  X-rays ordered at triage.  X-rays reviewed.  No acute bony abnormality identified.  Discussed results with patient.  Explained that there could still be a muscle or ligamentous injury that would not show up on any x-ray imaging.  Sling provided for support and stabilization.  Patient will be provided with outpatient orthopedic for further evaluation.  Encourage at home supportive therapy measures. Patient had ample opportunity for questions and discussion. All patient's questions were answered with full understanding. Strict return precautions discussed. Patient expresses understanding and agreement to plan.    Clinical Impressions(s) / ED Diagnoses   Final diagnoses:  Acute pain of left shoulder    ED Discharge Orders        Ordered    methocarbamol (ROBAXIN) 500 MG tablet  2 times daily     04/05/18 1823       Rosana HoesLayden, Jaselyn Nahm A, PA-C 04/05/18 Gracy [id number]    Plunkett, Whitney, MD 04/07/18  2155  

## 2018-10-16 ENCOUNTER — Emergency Department (HOSPITAL_COMMUNITY): Payer: Self-pay

## 2018-10-16 ENCOUNTER — Encounter (HOSPITAL_COMMUNITY): Payer: Self-pay

## 2018-10-16 ENCOUNTER — Other Ambulatory Visit: Payer: Self-pay

## 2018-10-16 ENCOUNTER — Emergency Department (HOSPITAL_COMMUNITY)
Admission: EM | Admit: 2018-10-16 | Discharge: 2018-10-16 | Disposition: A | Discharge status: Home / Self Care | Payer: Self-pay | Attending: Emergency Medicine | Admitting: Emergency Medicine

## 2018-10-16 DIAGNOSIS — Y929 Unspecified place or not applicable: Secondary | ICD-10-CM | POA: Insufficient documentation

## 2018-10-16 DIAGNOSIS — W108XXA Fall (on) (from) other stairs and steps, initial encounter: Secondary | ICD-10-CM | POA: Insufficient documentation

## 2018-10-16 DIAGNOSIS — S9032XA Contusion of left foot, initial encounter: Secondary | ICD-10-CM | POA: Insufficient documentation

## 2018-10-16 DIAGNOSIS — Y9389 Activity, other specified: Secondary | ICD-10-CM | POA: Insufficient documentation

## 2018-10-16 DIAGNOSIS — S9031XA Contusion of right foot, initial encounter: Secondary | ICD-10-CM | POA: Insufficient documentation

## 2018-10-16 DIAGNOSIS — J45909 Unspecified asthma, uncomplicated: Secondary | ICD-10-CM | POA: Insufficient documentation

## 2018-10-16 DIAGNOSIS — Y998 Other external cause status: Secondary | ICD-10-CM | POA: Insufficient documentation

## 2018-10-16 MED ORDER — ACETAMINOPHEN 500 MG PO TABS: 1000.0000 mg | ORAL_TABLET | Freq: Once | ORAL | Status: DC

## 2018-10-16 MED ORDER — HYDROCODONE-ACETAMINOPHEN 5-325 MG PO TABS
1.0000 | ORAL_TABLET | Freq: Once | ORAL | Status: AC
  Administered 2018-10-16: 1 via ORAL
  Filled 2018-10-16: qty 1

## 2018-10-16 NOTE — Discharge Instructions (Signed)
You can take Tylenol or Ibuprofen as directed for pain. You can alternate Tylenol and Ibuprofen every 4 hours. If you take Tylenol at 1pm, then you can take Ibuprofen at 5pm. Then you can take Tylenol again at 9pm.   Follow the RICE (Rest, Ice, Compression, Elevation) protocol as directed.   The postop shoes for support and comfort.  Follow-up with referred orthopedic doctor regarding his shoulder.  Return emergency part for any worsening pain, redness or swelling of feet, numbness/weakness of feet or any other worsening or concerning symptoms.

## 2018-10-16 NOTE — ED Notes (Signed)
Patient transported to X-ray 

## 2018-10-16 NOTE — ED Triage Notes (Signed)
Pt going down steps, tripped on second step and landed on landing on feet. Painful to walk.   C/o right shoulder/arm pain from injury >6 months ago.

## 2018-10-16 NOTE — ED Provider Notes (Signed)
[facility]   CSN: [id number] Arrival date & time: 10/16/18  2056     History   Chief Complaint Chief Complaint  Patient presents with  . Foot Injury    HPI Dustin Haley is a 28 y.o. male medical history of asthma who presents for evaluation of bilateral feet pain that is been ongoing since yesterday.  Patient reports that he fell down approximately 13 steps.  He reports that he was at the top of the stairs and felt like he was going to trip and so he tried to put his feet out in front of him to brace his fall.  He reports that he did not hit his head and denies any head injury or LOC.  Patient reports he was able to get up but states that he has had worsening pain when trying to ambulate on his feet.  He reports that he has been having just off his shoes with multiple socks so that his feet are comfortable when trying to walk.  He reports that anytime she puts pressure on the bottom of bilateral feet, he has worsening pain.  Patient reports he has not taken any medication for the pain.  Patient states that he has also had a shoulder pain that is been ongoing for a year.  He states that he was seen before previously but states that he still has difficulty moving the shoulder and states it has not gotten any better.  He has not followed up with anybody for this.  He states he did not injure the shoulder in the incident yesterday.  Patient denies any neck pain, back pain, numbness/weakness of his arms or legs, difficulty moving his arms or legs.  The history is provided by the patient.    Past Medical History:  Diagnosis Date  . Asthma     There are no active problems to display for this patient.   History reviewed. No pertinent surgical history.      Home Medications    Prior to Admission medications   Medication Sig Start Date End Date Taking? Authorizing Provider  cyclobenzaprine (FLEXERIL) 10 MG tablet Take 1 tablet  (10 mg total) by mouth 2 (two) times daily as needed for muscle spasms. Patient not taking: Reported on 11/23/2014 05/26/14   Blane OharaZavitz, Joshua, MD  diazepam (VALIUM) 5 MG tablet Take 1 tablet (5 mg total) by mouth 2 (two) times daily. Patient not taking: Reported on 11/23/2014 03/21/14   Pisciotta, Joni ReiningNicole, PA-C  famotidine (PEPCID) 20 MG tablet Take 1 tablet (20 mg total) by mouth 2 (two) times daily. Patient not taking: Reported on 11/23/2014 05/26/14   Blane OharaZavitz, Joshua, MD  methocarbamol (ROBAXIN) 500 MG tablet Take 1 tablet (500 mg total) by mouth 2 (two) times daily. 04/05/18   Maxwell CaulLayden,  A, PA-C    Family History History reviewed. No pertinent family history.  Social History Social History   Tobacco Use  . Smoking status: Never Smoker  . Smokeless tobacco: Never Used  Substance Use Topics  . Alcohol use: No  . Drug use: No     Allergies   Patient has no known allergies.   Review of Systems Review of Systems  Musculoskeletal: Negative for back pain and neck pain.       Bilateral foot pain  Neurological: Negative for weakness, numbness and headaches.  All other systems reviewed and are negative.    Physical Exam Updated Vital Signs BP 131/84   Pulse  97   Temp 98 F (36.7 C) (Oral)   Resp 16   SpO2 100%   Physical Exam Vitals signs and nursing note reviewed.  Constitutional:      Appearance: He is well-developed.  HENT:     Head: Normocephalic and atraumatic.  Eyes:     General: No scleral icterus.       Right eye: No discharge.        Left eye: No discharge.     Conjunctiva/sclera: Conjunctivae normal.  Neck:     Musculoskeletal: Full passive range of motion without pain.     Comments: Full flexion/extension and lateral movement of neck fully intact. No bony midline tenderness. No deformities or crepitus.  Cardiovascular:     Rate and Rhythm: Normal rate and regular rhythm.     Pulses:          Radial pulses are 2+ on the right side and 2+ on the left side.        Dorsalis pedis pulses are 2+ on the right side and 2+ on the left side.  Pulmonary:     Effort: Pulmonary effort is normal.  Musculoskeletal:     Thoracic back: He exhibits no tenderness.     Lumbar back: He exhibits no tenderness.     Comments: Decreased range of motion of left shoulder.  No deformity or crepitus noted.  No bony symptoms to left upper extremity.  No bony tenderness noted to right upper extremity.  No tenderness palpation noted to bilateral hips, bilateral knees, bilateral ankle, bilateral dorsal aspect of feet.  Diffuse tenderness palpation noted to the plantar surface of feet bilaterally.  No deformity or crepitus noted.  No overlying soft tissue swelling, ecchymosis.  Abrasion of right fifth toe that extends from the distal aspect all the way down to the metatarsal.  Abrasion noted to the left fifth toe.  Dorsiflexion and plantarflexion of bilateral feet intact but difficulty.  No deficits noted when palpating Achilles tendon bilaterally. No midline T or L spine tenderness.   Skin:    General: Skin is warm and dry.     Capillary Refill: Capillary refill takes less than 2 seconds.     Comments: Good distal cap refill. BLE are not dusky in appearance or cool to touch.  Neurological:     Mental Status: He is alert.     Comments: Follows commands, Moves all extremities  5/5 strength to BUE and BLE  Sensation intact throughout all major nerve distributions  Psychiatric:        Speech: Speech normal.        Behavior: Behavior normal.      ED Treatments / Results  Labs (all labs ordered are listed, but only abnormal results are displayed) Labs Reviewed - No data to display  EKG None  Radiology Dg Foot Complete Left  Result Date: 10/16/2018 CLINICAL DATA:  Foot pain EXAM: LEFT FOOT - COMPLETE 3+ VIEW COMPARISON:  None. FINDINGS: There is no evidence of fracture or dislocation. There is no evidence of arthropathy or other focal bone abnormality. Soft tissues are  unremarkable. IMPRESSION: Negative. Electronically Signed   By: Jasmine Pang M.D.   On: 10/16/2018 22:32   Dg Foot Complete Right  Result Date: 10/16/2018 CLINICAL DATA:  Foot pain EXAM: RIGHT FOOT COMPLETE - 3+ VIEW COMPARISON:  None. FINDINGS: There is no evidence of fracture or dislocation. There is no evidence of arthropathy or other focal bone abnormality. Linear possible soft tissue foreign body  at the nail bed of the third digit. IMPRESSION: 1. No acute osseous abnormality 2. Possible linear nailbed foreign body third digit Electronically Signed   By: Jasmine Pang M.D.   On: 10/16/2018 22:33    Procedures Procedures (including critical care time)  Medications Ordered in ED Medications  HYDROcodone-acetaminophen (NORCO/VICODIN) 5-325 MG per tablet 1 tablet (has no administration in time range)     Initial Impression / Assessment and Plan / ED Course  I have reviewed the triage vital signs and the nursing notes.  Pertinent labs & imaging results that were available during my care of the patient were reviewed by me and considered in my medical decision making (see chart for details).     28 year old male who presents for evaluation of bilateral feet pain after mechanical fall that occurred yesterday.  Patient reports that he was walking down approximately 13 steps and states he was towards the top when he was going to treatment.  In order to avoid him falling, he tried to jump with feet first.  He reports he landed on the concrete with his feet.  He states he did not have any head injury or LOC.  Reports that since then, he has difficulty ambulating on the feet secondary to pain in the plantar surface.  Patient does report he has been ambulating but states that he had to wear socks and shoes to help with the pain.  Patient states he has not had any numbness/weakness.  He denies any neck pain, back pain.  No midline tenderness noted on exam.  Exam obvious tenderness palpation of the  plantar surface of both feet bilaterally.  No deformity or crepitus noted. Consider contusion vs fracture vs dislocation.  Will plan for x-ray for evaluation of fracture dislocation.  History/physical exam not concerning for acute spinal cord injury.  He is able to move the bilateral lower extremities without any difficulty and has normal strength.  XRs reviewed.  Negative for any acute bony abnormality.  There is mention of possible linear foreign body in the right third digit.  Reevaluation showed no evidence of foreign body noted on the right third digit.  Discussed results with patient.  We will plan to give postop shoes.  Additionally, I discussed with patient regarding his chronic shoulder pain.  At this time, indication for acute emergent intervention.  He denies any injury in the fall yesterday.  He states he has still had limited range of motion from his injury several months ago.  We will plan to give him outpatient orthopedic referral. At this time, patient exhibits no emergent life-threatening condition that require further evaluation in ED or admission. Patient had ample opportunity for questions and discussion. All patient's questions were answered with full understanding. Strict return precautions discussed. Patient expresses understanding and agreement to plan.   Portions of this note were generated with Scientist, clinical (histocompatibility and immunogenetics). Dictation errors may occur despite best attempts at proofreading.  Final Clinical Impressions(s) / ED Diagnoses   Final diagnoses:  Contusion of left foot, initial encounter  Contusion of right foot, initial encounter    ED Discharge Orders    None       Rosana Hoes 10/16/18 2301    Loren Racer, MD 10/17/18 2312

## 2019-02-01 ENCOUNTER — Encounter (HOSPITAL_COMMUNITY): Payer: Self-pay | Admitting: Emergency Medicine

## 2019-02-01 ENCOUNTER — Other Ambulatory Visit: Payer: Self-pay

## 2019-02-01 ENCOUNTER — Emergency Department (HOSPITAL_COMMUNITY)
Admission: EM | Admit: 2019-02-01 | Discharge: 2019-02-01 | Disposition: A | Discharge status: Home / Self Care | Payer: Self-pay | Attending: Emergency Medicine | Admitting: Emergency Medicine

## 2019-02-01 ENCOUNTER — Emergency Department (HOSPITAL_COMMUNITY): Payer: Self-pay

## 2019-02-01 DIAGNOSIS — J45909 Unspecified asthma, uncomplicated: Secondary | ICD-10-CM | POA: Insufficient documentation

## 2019-02-01 DIAGNOSIS — M7542 Impingement syndrome of left shoulder: Secondary | ICD-10-CM | POA: Insufficient documentation

## 2019-02-01 MED ORDER — IBUPROFEN 600 MG PO TABS: 600.0000 mg | ORAL_TABLET | Freq: Four times a day (QID) | ORAL | 0 refills | Status: AC | PRN

## 2019-02-01 MED ORDER — DEXAMETHASONE 4 MG PO TABS: 4.0000 mg | ORAL_TABLET | Freq: Two times a day (BID) | ORAL | 0 refills | Status: AC

## 2019-02-01 NOTE — ED Triage Notes (Signed)
PT c/o pain to left shoulder after lifting an heavy object over his head this past Friday. PT has good ROM but painful.

## 2019-02-06 NOTE — ED Provider Notes (Signed)
[facility] EMERGENCY DEPARTMENT Provider Note   CSN: [id number] Arrival date & time: 02/01/19  1615    History   Chief Complaint Chief Complaint  Patient presents with  . Shoulder Pain    HPI Dustin Haley is a 28 y.o. male.     HPI   27yM with L shoulder pain. Has had for over a year. Worse since Friday when he was lifting heavy object over head. Does physical labor at work. Can range his arm but with increased pain. No numbness or tingling. No neck pain. hasnt' tried anything for his symptoms more recently.   Past Medical History:  Diagnosis Date  . Asthma     There are no active problems to display for this patient.   Past Surgical History:  Procedure Laterality Date  . APPENDECTOMY    . RECONSTRUCTION OF NOSE          Home Medications    Prior to Admission medications   Medication Sig Start Date End Date Taking? Authorizing Provider  cyclobenzaprine (FLEXERIL) 10 MG tablet Take 1 tablet (10 mg total) by mouth 2 (two) times daily as needed for muscle spasms. Patient not taking: Reported on 11/23/2014 05/26/14   Blane Ohara, MD  dexamethasone (DECADRON) 4 MG tablet Take 1 tablet (4 mg total) by mouth 2 (two) times daily. 02/01/19   Raeford Razor, MD  diazepam (VALIUM) 5 MG tablet Take 1 tablet (5 mg total) by mouth 2 (two) times daily. Patient not taking: Reported on 11/23/2014 03/21/14   Pisciotta, Joni Reining, PA-C  famotidine (PEPCID) 20 MG tablet Take 1 tablet (20 mg total) by mouth 2 (two) times daily. Patient not taking: Reported on 11/23/2014 05/26/14   Blane Ohara, MD  ibuprofen (ADVIL) 600 MG tablet Take 1 tablet (600 mg total) by mouth every 6 (six) hours as needed. 02/01/19   Raeford Razor, MD  methocarbamol (ROBAXIN) 500 MG tablet Take 1 tablet (500 mg total) by mouth 2 (two) times daily. 04/05/18   Maxwell Caul, PA-C    Family History History reviewed. No pertinent family history.  Social History Social History   Tobacco Use  . Smoking  status: Never Smoker  . Smokeless tobacco: Never Used  Substance Use Topics  . Alcohol use: No  . Drug use: No     Allergies   Patient has no known allergies.   Review of Systems Review of Systems  All systems reviewed and negative, other than as noted in HPI.  Physical Exam Updated Vital Signs BP (!) 132/55 (BP Location: Right Arm)   Pulse 86   Temp 98.6 F (37 C) (Oral)   Resp 18   Ht 5\' 9"  (1.753 m)   Wt 72.6 kg   SpO2 98%   BMI 23.63 kg/m   Physical Exam Vitals signs and nursing note reviewed.  Constitutional:      General: He is not in acute distress.    Appearance: He is well-developed.  HENT:     Head: Normocephalic and atraumatic.  Eyes:     General:        Right eye: No discharge.        Left eye: No discharge.     Conjunctiva/sclera: Conjunctivae normal.  Neck:     Musculoskeletal: Neck supple.  Cardiovascular:     Rate and Rhythm: Normal rate and regular rhythm.     Heart sounds: Normal heart sounds. No murmur. No friction rub. No gallop.   Pulmonary:     Effort:  Pulmonary effort is normal. No respiratory distress.     Breath sounds: Normal breath sounds.  Abdominal:     General: There is no distension.     Palpations: Abdomen is soft.     Tenderness: There is no abdominal tenderness.  Musculoskeletal:        General: Tenderness present.     Comments: TTP beneath L anterior shoulder. Positive hawkins kennedy test.   Skin:    General: Skin is warm and dry.  Neurological:     Mental Status: He is alert.  Psychiatric:        Behavior: Behavior normal.        Thought Content: Thought content normal.      ED Treatments / Results  Labs (all labs ordered are listed, but only abnormal results are displayed) Labs Reviewed - No data to display  EKG None  Radiology No results found.  Procedures Procedures (including critical care time)  Medications Ordered in ED Medications - No data to display   Initial Impression / Assessment and  Plan / ED Course  I have reviewed the triage vital signs and the nursing notes.  Pertinent labs & imaging results that were available during my care of the patient were reviewed by me and considered in my medical decision making (see chart for details).       27yM with L shoulder pain. Likely subacromial impingement. Plan symptomatic tx. Restrictions discussed. Ortho or sports med FU if doesn't respond to conservative measures.   Final Clinical Impressions(s) / ED Diagnoses   Final diagnoses:  Subacromial impingement of left shoulder    ED Discharge Orders         Ordered    dexamethasone (DECADRON) 4 MG tablet  2 times daily     02/01/19 1708    ibuprofen (ADVIL) 600 MG tablet  Every 6 hours PRN     02/01/19 1708           Raeford RazorKohut, Messi Twedt, MD 02/06/19 1200

## 2020-07-15 IMAGING — DX DG FOOT COMPLETE 3+V*L*
3 series · 3 of 3 positions shown · non-contrast
Comparison: None.

CLINICAL DATA: Foot pain

EXAM:
LEFT FOOT - COMPLETE 3+ VIEW

[foot ap]
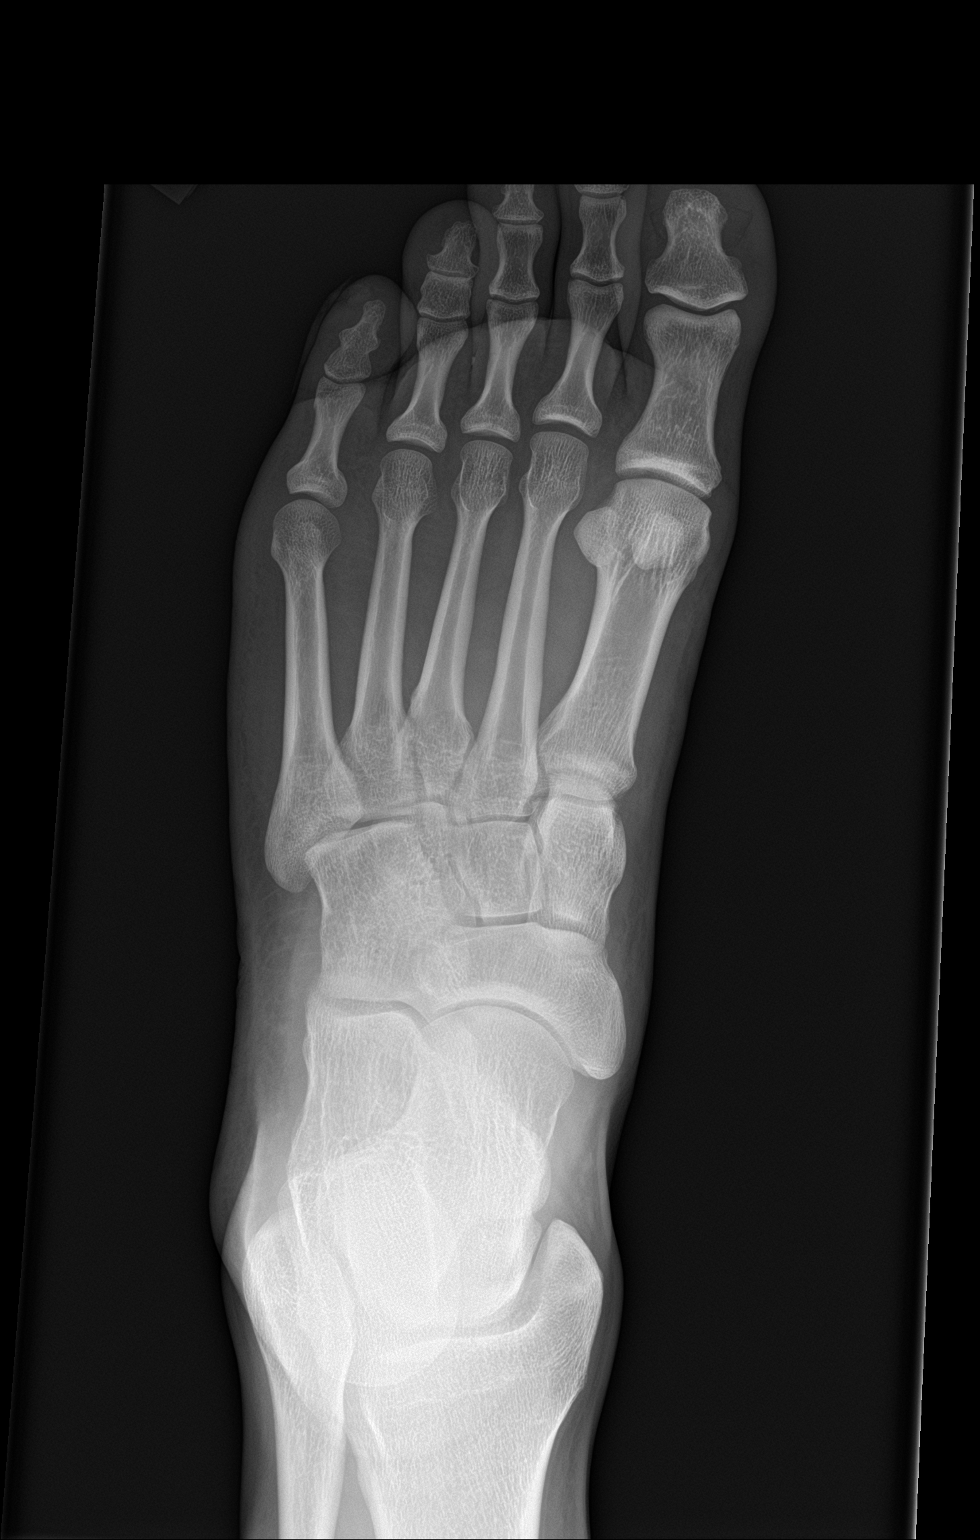

[foot obl]
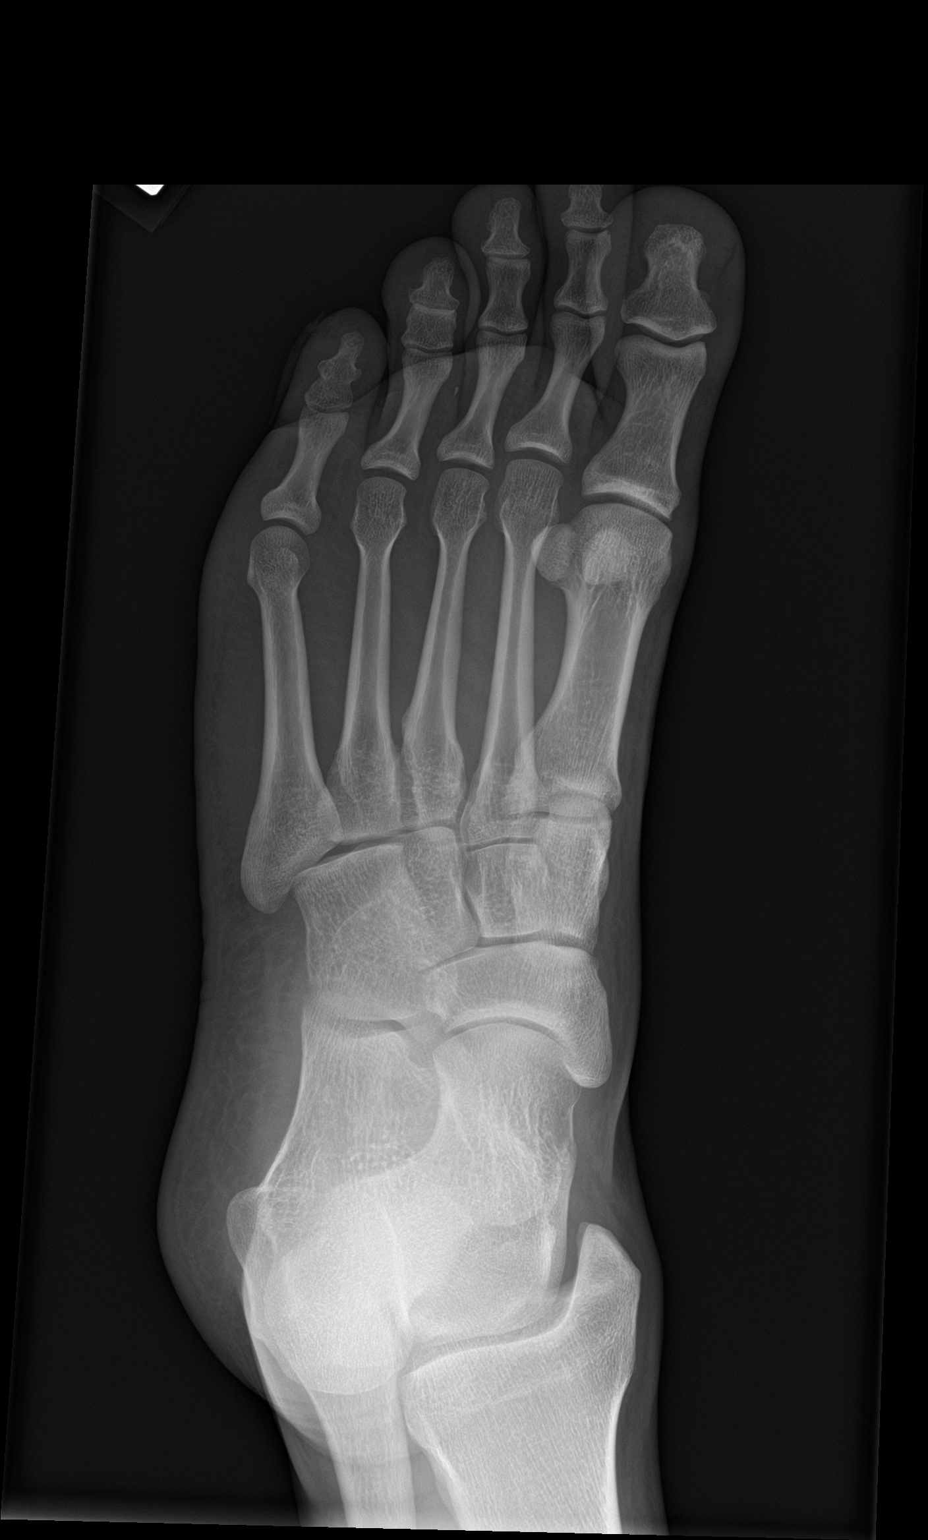

[foot lat]
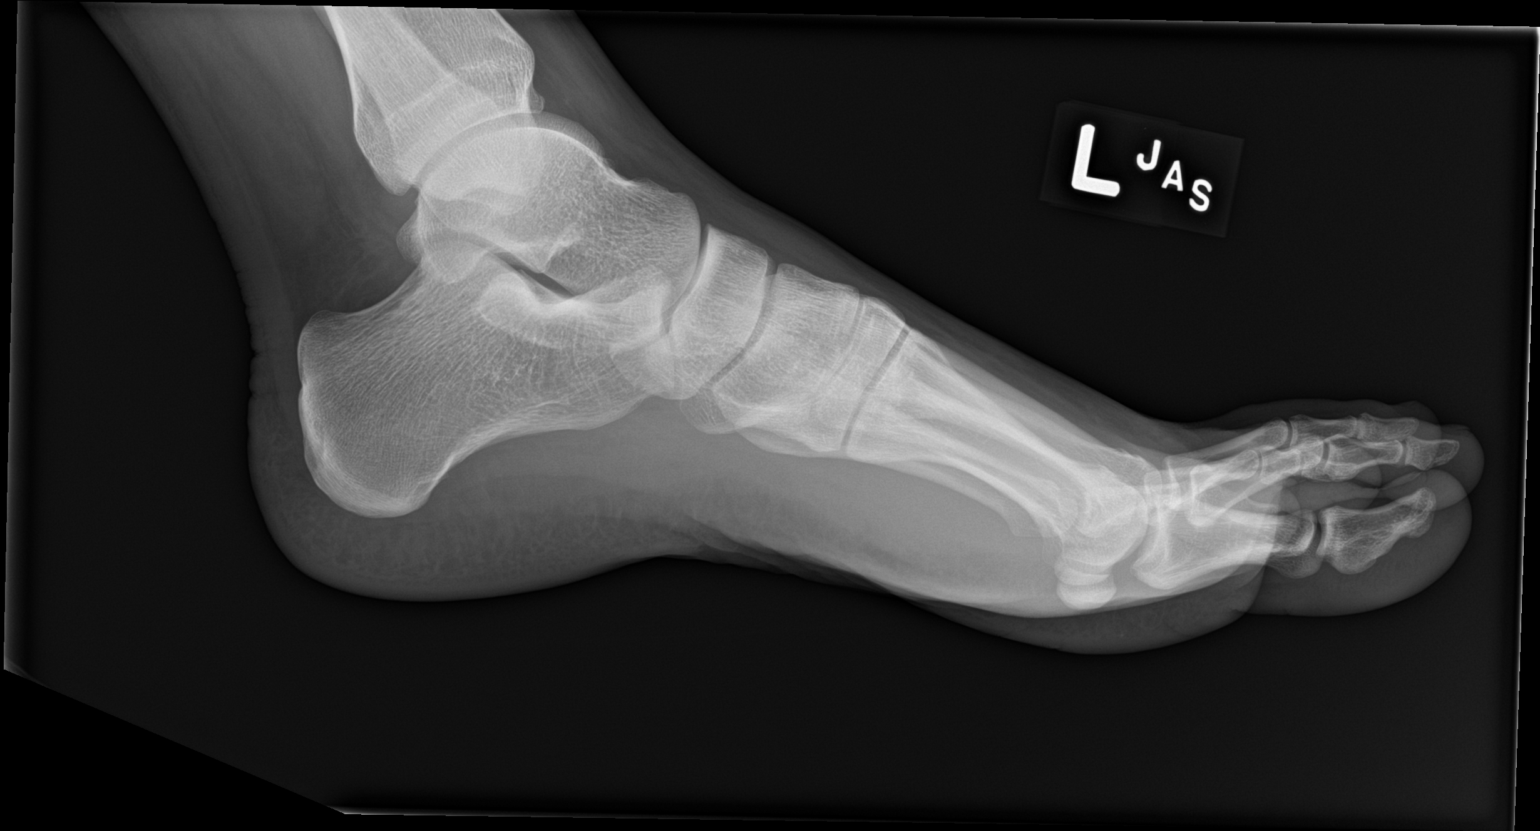

[3 of 3 positions shown; findings below may reference images not displayed]

FINDINGS: There is no evidence of fracture or dislocation. There is no
evidence of arthropathy or other focal bone abnormality. Soft
tissues are unremarkable.
IMPRESSION: Negative.

## 2020-07-15 IMAGING — DX DG FOOT COMPLETE 3+V*R*
3 series · 3 of 3 positions shown · non-contrast
Comparison: None.

CLINICAL DATA: Foot pain

EXAM:
RIGHT FOOT COMPLETE - 3+ VIEW

[foot ap]
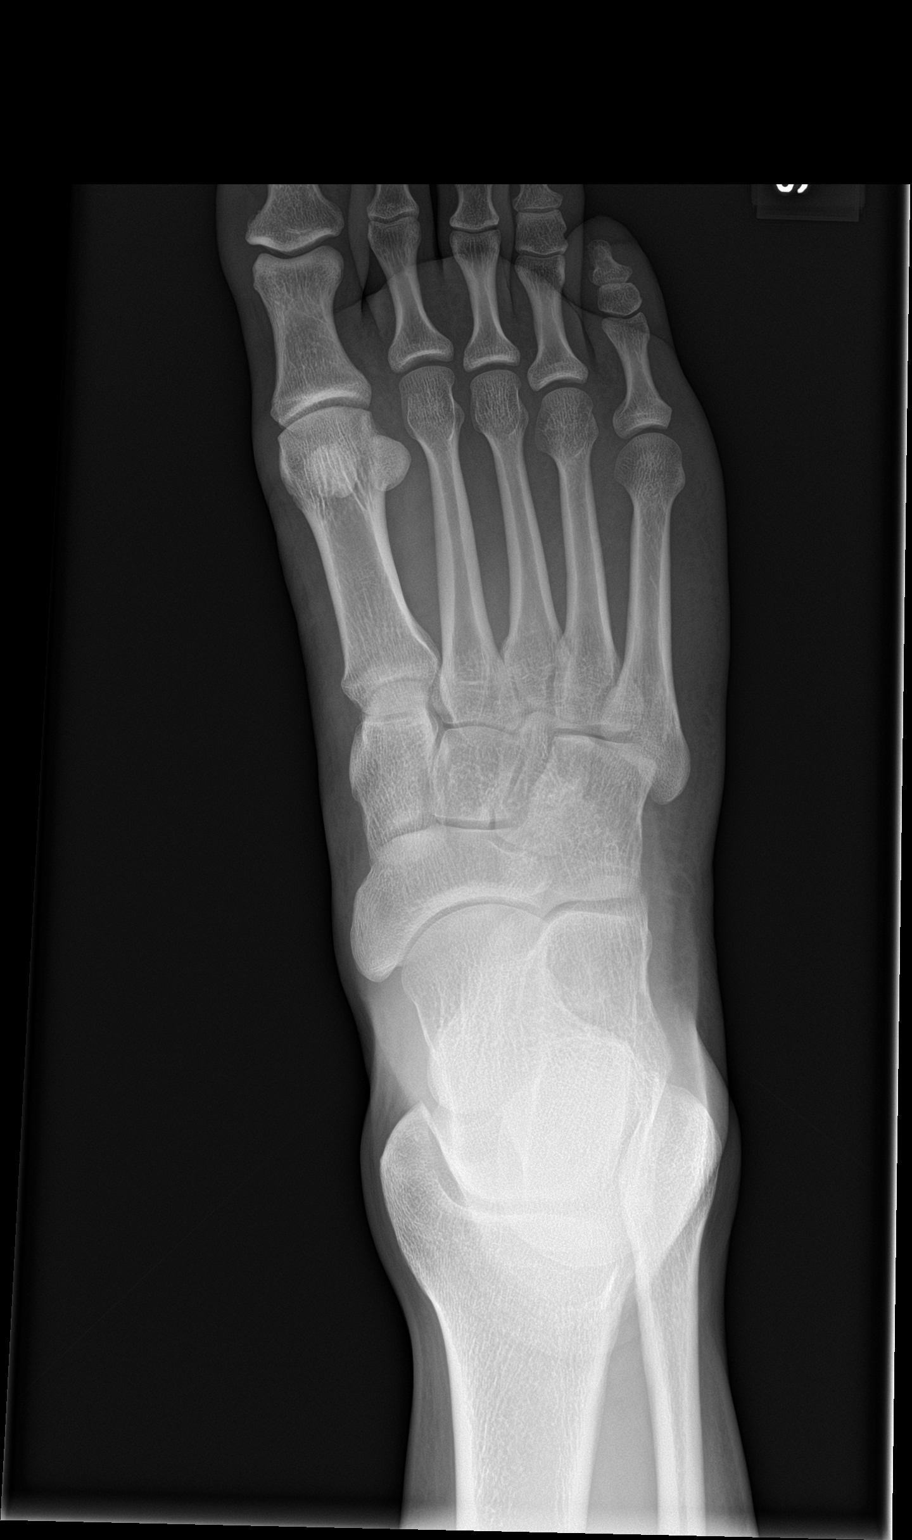

[foot obl]
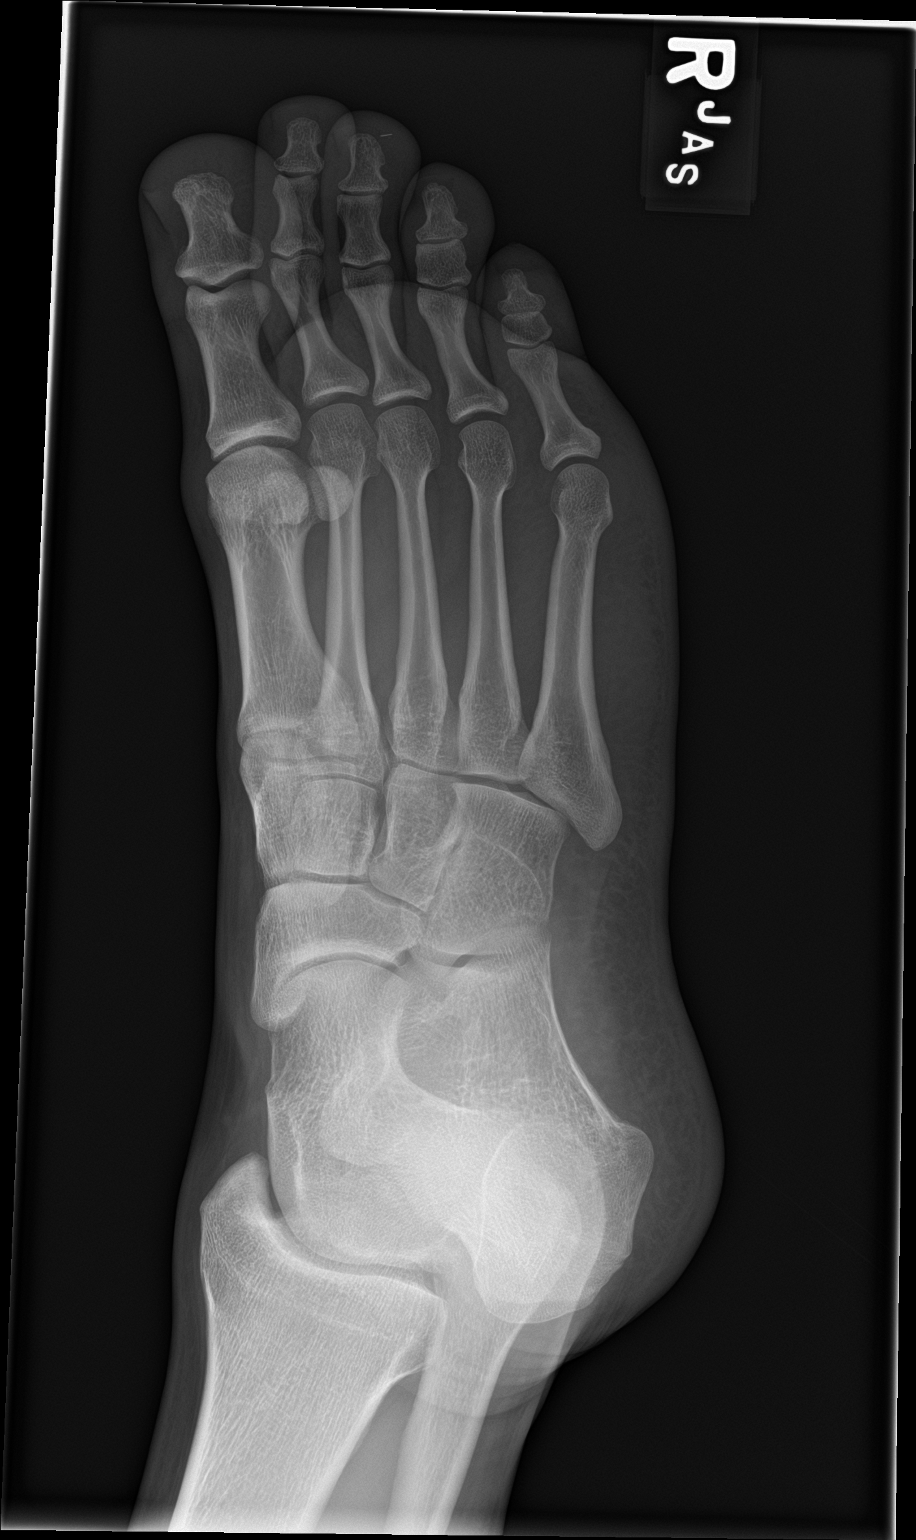

[foot lat]
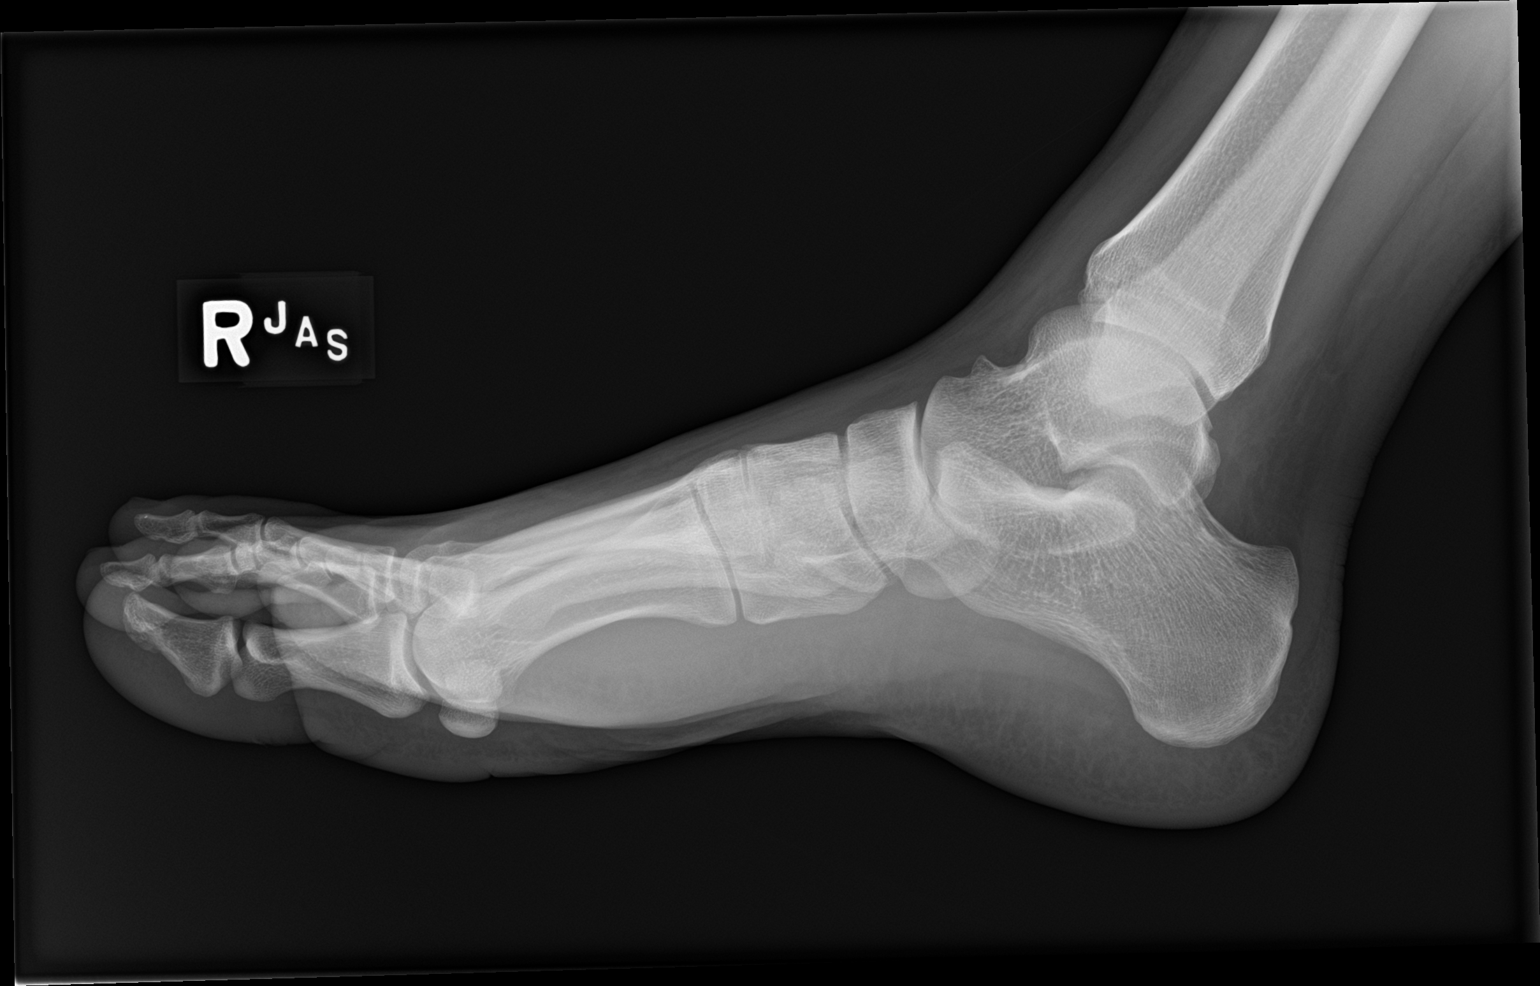

[3 of 3 positions shown; findings below may reference images not displayed]

FINDINGS: There is no evidence of fracture or dislocation. There is no
evidence of arthropathy or other focal bone abnormality. Linear
possible soft tissue foreign body at the nail bed of the third
digit.
IMPRESSION: 1. No acute osseous abnormality
2. Possible linear nailbed foreign body third digit
